# Patient Record
Sex: Male | Born: 1955 | Race: Black or African American | Hispanic: No | Smoking: Never smoker
Health system: Southern US, Community
[De-identification: ages and names within clinical notes are randomized; demographics above are authoritative.]

## PROBLEM LIST (undated history)

## (undated) ENCOUNTER — Ambulatory Visit: Disposition: A | Discharge status: Home / Self Care | Payer: 59

## (undated) DIAGNOSIS — T7840XA Allergy, unspecified, initial encounter: Secondary | ICD-10-CM

## (undated) DIAGNOSIS — E782 Mixed hyperlipidemia: Secondary | ICD-10-CM

## (undated) DIAGNOSIS — I10 Essential (primary) hypertension: Secondary | ICD-10-CM

## (undated) HISTORY — DX: Essential (primary) hypertension: I10

## (undated) HISTORY — PX: HAND SURGERY: SHX662

## (undated) HISTORY — DX: Allergy, unspecified, initial encounter: T78.40XA

## (undated) HISTORY — DX: Mixed hyperlipidemia: E78.2

---

## 2007-09-02 ENCOUNTER — Ambulatory Visit: Payer: Self-pay | Admitting: Gastroenterology

## 2007-09-02 LAB — CONVERTED CEMR LAB
Basophils Relative: 0.8 % (ref 0.0–1.0)
Eosinophils Relative: 5 % (ref 0.0–5.0)
HCT: 43.8 % (ref 39.0–52.0)
Hemoglobin: 15.5 g/dL (ref 13.0–17.0)
Lymphocytes Relative: 42.7 % (ref 12.0–46.0)
Monocytes Absolute: 0.5 10*3/uL (ref 0.2–0.7)
Neutrophils Relative %: 41.1 % — ABNORMAL LOW (ref 43.0–77.0)
RDW: 12.7 % (ref 11.5–14.6)
WBC: 4.7 10*3/uL (ref 4.5–10.5)

## 2007-09-07 ENCOUNTER — Ambulatory Visit: Payer: Self-pay | Admitting: Gastroenterology

## 2007-09-07 ENCOUNTER — Encounter: Payer: Self-pay | Admitting: Gastroenterology

## 2011-01-13 NOTE — Assessment & Plan Note (Signed)
[facility]                         GASTROENTEROLOGY OFFICE NOTE   Leon Lopez, Leon Lopez                         MRN:          [id number]  DATE:09/02/2007                            DOB:          1955-09-22    REFERRING PHYSICIAN:  Belva Lopez, M.D.   PRIMARY CARE PHYSICIAN:  Leon Lopez, M.D.   REASON FOR REFERRAL:  Dr. Belva Lopez asked me to evaluate Leon Lopez in  consultation regarding intermittent bright red blood per rectum.   HISTORY OF PRESENT ILLNESS:  Leon Lopez is a very pleasant 55 year old  man who has had approximately 6 weeks of intermittent bright red blood  per rectum.  This happens when he moves his bowels.  It sounds like it  is minor bleeding, just a little bit of blood on the tissue paper.  He  does not have constipation or diarrhea.  No significant abdominal pains.  He has never had this bleeding before.  He has never had a colonoscopy.   REVIEW OF SYSTEMS:  Notable for stable weight, and is otherwise  essentially normal, and is available on his nursing intake sheet.   PAST MEDICAL HISTORY:  Sinus trouble.   CURRENT MEDICATIONS:  None.   ALLERGIES:  No known drug allergies.   SOCIAL HISTORY:  Divorced.  Lives by himself.  He has three children.  Works for [employer].  Nonsmoker.  Drinks alcohol occasionally.   FAMILY HISTORY:  No colon cancer or colon polyps in the family.   PHYSICAL EXAMINATION:  VITAL SIGNS:  Height 5 feet 7 inches, weight 168  pounds, blood pressure 122/82, pulse 66.  CONSTITUTIONAL:  Generally well appearing.  NEUROLOGIC:  Alert and oriented x3.  HEENT:  Eyes:  Extraocular movements intact.  Mouth, oropharynx moist.  No lesions.  NECK:  Supple.  No lymphadenopathy.  CARDIOVASCULAR:  Heart:  Regular rate and rhythm.  LUNGS:  Clear to auscultation bilaterally.  ABDOMEN  Soft, nontender, nondistended.  Normal bowel sounds.  EXTREMITIES:  No lower extremity edema.  SKIN:  No rashes or lesions of  the visible extremities.  RECTAL:  No internal or external hemorrhoids.  No anal fissuring.  No  masses palpated on rectal examination.   ASSESSMENT AND PLAN:  A 55 year old man with limited rectal bleeding.   First, it does not appear that Leon Lopez is anemic clinically, but I  will get a CBC just to make sure.  The etiology of his mild rectal  bleeding is likely hemorrhoidal, although polyps and other neoplasms can  also cause this type of bleeding.  In the absence of diarrhea, it is  unlikely this is from colitis.  I will arrange for him to have a full  colonoscopy performed at his soonest convenience, and I see no reason  for any further blood tests or imaging studies other than that mentioned  above.     Leon Fee, MD  Electronically Signed    DPJ/MedQ  DD: 09/02/2007  DT: 09/02/2007  Job #: [id number]   cc:   Leon Lopez, M.D.  Leon Lopez, M.D.

## 2012-08-29 ENCOUNTER — Encounter: Payer: Self-pay | Admitting: Gastroenterology

## 2013-02-24 ENCOUNTER — Encounter: Payer: Self-pay | Admitting: Gastroenterology

## 2015-02-04 ENCOUNTER — Encounter (INDEPENDENT_AMBULATORY_CARE_PROVIDER_SITE_OTHER): Payer: Self-pay

## 2015-02-04 ENCOUNTER — Ambulatory Visit (INDEPENDENT_AMBULATORY_CARE_PROVIDER_SITE_OTHER): Payer: 59 | Admitting: Physician Assistant

## 2015-02-04 ENCOUNTER — Other Ambulatory Visit: Payer: Self-pay | Admitting: Physician Assistant

## 2015-02-04 ENCOUNTER — Encounter: Payer: Self-pay | Admitting: Physician Assistant

## 2015-02-04 ENCOUNTER — Ambulatory Visit (HOSPITAL_COMMUNITY)
Admission: RE | Admit: 2015-02-04 | Discharge: 2015-02-04 | Disposition: A | Discharge status: Home / Self Care | Payer: 59 | Source: Ambulatory Visit | Attending: Physician Assistant | Admitting: Physician Assistant

## 2015-02-04 ENCOUNTER — Ambulatory Visit: Payer: Self-pay

## 2015-02-04 VITALS — BP 141/86 | HR 75 | Temp 97.6°F | Ht 67.0 in | Wt 191.0 lb

## 2015-02-04 DIAGNOSIS — N5089 Other specified disorders of the male genital organs: Secondary | ICD-10-CM

## 2015-02-04 DIAGNOSIS — N451 Epididymitis: Secondary | ICD-10-CM

## 2015-02-04 DIAGNOSIS — N508 Other specified disorders of male genital organs: Secondary | ICD-10-CM

## 2015-02-04 DIAGNOSIS — N50812 Left testicular pain: Secondary | ICD-10-CM

## 2015-02-04 LAB — POCT URINALYSIS DIPSTICK
Bilirubin, UA: NEGATIVE
Glucose, UA: NEGATIVE
KETONES UA: NEGATIVE
LEUKOCYTES UA: NEGATIVE
Nitrite, UA: NEGATIVE
PH UA: 6
PROTEIN UA: NEGATIVE
Spec Grav, UA: 1.02
UROBILINOGEN UA: NEGATIVE

## 2015-02-04 LAB — POCT UA - MICROSCOPIC ONLY
BACTERIA, U MICROSCOPIC: NEGATIVE
CASTS, UR, LPF, POC: NEGATIVE
Crystals, Ur, HPF, POC: NEGATIVE
EPITHELIAL CELLS, URINE PER MICROSCOPY: NEGATIVE
YEAST UA: NEGATIVE

## 2015-02-04 MED ORDER — LEVOFLOXACIN 250 MG PO TABS: 250.0000 mg | ORAL_TABLET | Freq: Every day | ORAL | Status: DC

## 2015-02-04 NOTE — Progress Notes (Signed)
   Subjective:    Patient ID: Leon Lopez, male    DOB: Apr 26, 1956, 59 y.o.   MRN: [id number]  HPI 59 y/o male presents with c/o pain in testicles x 3 days. Woke up Saturday morning and felt immediate soreness in his groin. Pain has worsened and his left testicle is swollen. Has not tried anything to relieve symptoms.       Review of Systems  Constitutional: Negative.   Gastrointestinal:       Suprapubic , groin pain on left side   Genitourinary: Negative for dysuria, frequency, hematuria, difficulty urinating and genital sores.       Swollen right testicle   All other systems reviewed and are negative.      Objective:   Physical Exam  Constitutional: He is oriented to person, place, and time. He appears well-developed and well-nourished. No distress.  Cardiovascular: Normal rate.   Genitourinary: Penis normal.  Enlarged left testicle/scrotum. Very ttp No erythema or discoloration noted, however difficult to determine d/t patient being African American   Neurological: He is oriented to person, place, and time.  Skin: He is not diaphoretic.  Psychiatric: He has a normal mood and affect. His behavior is normal. Judgment and thought content normal.  Nursing note and vitals reviewed.         Assessment & Plan:  1. Pain in left testicle - Patient sent to [facility] for stat U/S to r/o testicular torsion - POCT urinalysis dipstick - POCT UA - Microscopic Only - US Scrotum; Future   Will determine plan of treatment  once radiology results obtained   Brayten Komar A. Chauncey ReadingGann PA-C

## 2015-02-14 ENCOUNTER — Ambulatory Visit (INDEPENDENT_AMBULATORY_CARE_PROVIDER_SITE_OTHER): Payer: 59 | Admitting: Physician Assistant

## 2015-02-14 ENCOUNTER — Encounter: Payer: Self-pay | Admitting: Physician Assistant

## 2015-02-14 VITALS — BP 128/82 | HR 75 | Temp 98.2°F | Ht 67.0 in | Wt 195.0 lb

## 2015-02-14 DIAGNOSIS — N451 Epididymitis: Secondary | ICD-10-CM | POA: Diagnosis not present

## 2015-02-14 NOTE — Progress Notes (Signed)
   Subjective:    Patient ID: Leon Lopez, male    DOB: [birth year], 59 y.o.   MRN: [id number]  HPI 59 y/o male presents for f/u of epididymitis. He has 1 pill of Levaquin remaining. He states that he is much improved. No swelling. States he has a "twinge" once in a while.     Review of Systems  Constitutional: Negative for fever, chills and diaphoresis.  Gastrointestinal: Negative for nausea.  Endocrine: Negative for polyuria.  Genitourinary: Negative for dysuria, urgency, frequency, hematuria, decreased urine volume, discharge, penile swelling, scrotal swelling, difficulty urinating, genital sores, penile pain and testicular pain.  All other systems reviewed and are negative.      Objective:   Physical Exam  Constitutional: He is oriented to person, place, and time. He appears well-developed and well-nourished. No distress.  Pulmonary/Chest: Effort normal.  Genitourinary: Penis normal.  Trace ttp of left testicle,scrotum  Decreased edema  Neurological: He is alert and oriented to person, place, and time.  Skin: He is not diaphoretic.  Psychiatric: He has a normal mood and affect. His behavior is normal. Judgment and thought content normal.  Nursing note and vitals reviewed.         Assessment & Plan:  1. Epididymitis, left -Continue taking remainder of antibiotic - If symptoms, pain recur prior to appointment with Urology, call office for additional antibiotic or evaluation   RTC prn   Tiffany A. Chauncey Reading PA-C

## 2019-04-12 NOTE — Progress Notes (Signed)
New Patient Office Visit  Assessment & Plan:  1. Well adult exam - Preventive care education provided. Encouraged exercise.  - CBC with Differential/Platelet - CMP14+EGFR - Lipid panel - Hepatitis C antibody - HIV Antibody (routine testing w rflx)  2. Immunization due - Tdap (ADACEL) 12-30-13.5 LF-MCG/0.5 injection; Inject 0.5 mLs into the muscle once for 1 dose.  Dispense: 0.5 mL; Refill: 0 - Zoster Vaccine Adjuvanted [facility]) injection; Inject 0.5 mLs into the muscle once for 1 dose.  Dispense: 0.5 mL; Refill: 0  3. Elevated BP without diagnosis of hypertension - Education provided on the DASH diet. Encouraged exercise and weight loss. Limit salt.  - CMP14+EGFR - Lipid panel  4. Leg cramps - Chronic problem. Patient does not drink very much water throughout the day; encouraged adequate hydration. Education provided on leg cramps. - CBC with Differential/Platelet - Magnesium  5. Obesity (BMI 30-39.9) - Encouraged exercise for weight loss.  - CMP14+EGFR - Lipid panel  6. Encounter for hepatitis C screening test for low risk patient - Hepatitis C antibody  7. Screening for HIV (human immunodeficiency virus) - HIV Antibody (routine testing w rflx)  8. Colon cancer screening - Ambulatory referral to Gastroenterology   Follow-up: Return in about 6 weeks (around 05/30/2019) for HTN.   Hendricks Limes, MSN, APRN, FNP-C [facility]  Subjective:  Patient ID: Leon Lopez, male    DOB: 03/31/1956  Age: 63 y.o. MRN: [id number]  Patient Care Team: Loman Brooklyn, FNP as PCP - General (Family Medicine)  CC:  Chief Complaint  Patient presents with  . New Patient (Initial Visit)  . Annual Exam    wants lab work    HPI Leon Lopez presents to establish care. Patient was last seen in 2016 by this practice, making him a new patient today. Patient has no concerns today.   Patient reports a lot of leg cramps that he has "always had". They go away  quickly.   Patient does not check BP at home at all. Denies shortness of breath, chest pain, headaches, or vision changes. He feels his BP is elevated today due to wearing the mask and being claustrophobic. He is able to check his BP at work.   Review of Systems  Constitutional: Negative for chills, fever, malaise/fatigue and weight loss.  HENT: Negative for congestion, ear discharge, ear pain, nosebleeds, sinus pain, sore throat and tinnitus.   Eyes: Negative for blurred vision, double vision, pain, discharge and redness.  Respiratory: Negative for cough, shortness of breath and wheezing.   Cardiovascular: Negative for chest pain, palpitations and leg swelling.  Gastrointestinal: Negative for abdominal pain, constipation, diarrhea, heartburn, nausea and vomiting.  Genitourinary: Negative for dysuria, frequency and urgency.       Denies trouble initiating a urine stream, weak stream, split stream, and dribbling.   Musculoskeletal: Negative for myalgias.  Skin: Negative for rash.  Neurological: Negative for dizziness, seizures, weakness and headaches.  Psychiatric/Behavioral: Negative for depression, substance abuse and suicidal ideas. The patient is not nervous/anxious.      Current Outpatient Medications:  .  Tdap (ADACEL) 12-30-13.5 LF-MCG/0.5 injection, Inject 0.5 mLs into the muscle once for 1 dose., Disp: 0.5 mL, Rfl: 0 .  Zoster Vaccine Adjuvanted (SHINGRIX) injection, Inject 0.5 mLs into the muscle once for 1 dose., Disp: 0.5 mL, Rfl: 0  No Known Allergies  History reviewed. No pertinent past medical history.  Past Surgical History:  Procedure Laterality Date  . HAND SURGERY Left  History reviewed. No pertinent family history.  Social History   Socioeconomic History  . Marital status: Divorced    Spouse name: Not on file  . Number of children: 3  . Years of education: 82  . Highest education level: High school graduate  Occupational History  . Not on file  Social  Needs  . Financial resource strain: Not on file  . Food insecurity    Worry: Not on file    Inability: Not on file  . Transportation needs    Medical: Not on file    Non-medical: Not on file  Tobacco Use  . Smoking status: Never Smoker  . Smokeless tobacco: Never Used  Substance and Sexual Activity  . Alcohol use: Yes    Alcohol/week: 0.0 standard drinks    Comment: 1-2 beers on weekends  . Drug use: Never  . Sexual activity: Yes  Lifestyle  . Physical activity    Days per week: Not on file    Minutes per session: Not on file  . Stress: Not on file  Relationships  . Social Herbalist on phone: Not on file    Gets together: Not on file    Attends religious service: Not on file    Active member of club or organization: Not on file    Attends meetings of clubs or organizations: Not on file    Relationship status: Not on file  . Intimate partner violence    Fear of current or ex partner: Not on file    Emotionally abused: Not on file    Physically abused: Not on file    Forced sexual activity: Not on file  Other Topics Concern  . Not on file  Social History Narrative  . Not on file    Objective:   Today's Vitals: BP (!) 170/104   Pulse 65   Temp 98.4 F (36.9 C) (Oral)   Ht '5\' 7"'$  (1.702 m)   Wt 194 lb 3.2 oz (88.1 kg)   BMI 30.42 kg/m   Physical Exam Vitals signs reviewed.  Constitutional:      General: He is not in acute distress.    Appearance: Normal appearance. He is obese. He is not ill-appearing, toxic-appearing or diaphoretic.  HENT:     Head: Normocephalic and atraumatic.     Right Ear: Tympanic membrane, ear canal and external ear normal. There is no impacted cerumen.     Left Ear: Tympanic membrane, ear canal and external ear normal. There is no impacted cerumen.     Nose: Nose normal. No congestion or rhinorrhea.     Mouth/Throat:     Mouth: Mucous membranes are moist.     Pharynx: Oropharynx is clear. No oropharyngeal exudate or  posterior oropharyngeal erythema.  Eyes:     General: No scleral icterus.       Right eye: No discharge.        Left eye: No discharge.     Conjunctiva/sclera: Conjunctivae normal.     Pupils: Pupils are equal, round, and reactive to light.  Neck:     Musculoskeletal: Normal range of motion and neck supple. No neck rigidity or muscular tenderness.     Vascular: No carotid bruit.  Cardiovascular:     Rate and Rhythm: Normal rate and regular rhythm.     Heart sounds: Normal heart sounds. No murmur. No friction rub. No gallop.   Pulmonary:     Effort: Pulmonary effort is normal. No respiratory distress.  Breath sounds: Normal breath sounds. No stridor. No wheezing, rhonchi or rales.  Abdominal:     General: Abdomen is flat. Bowel sounds are normal. There is no distension.     Palpations: Abdomen is soft. There is no mass.     Tenderness: There is no abdominal tenderness. There is no guarding or rebound.     Hernia: No hernia is present.  Musculoskeletal: Normal range of motion.     Right lower leg: No edema.     Left lower leg: No edema.  Lymphadenopathy:     Cervical: No cervical adenopathy.  Skin:    General: Skin is warm and dry.     Capillary Refill: Capillary refill takes less than 2 seconds.  Neurological:     General: No focal deficit present.     Mental Status: He is alert and oriented to person, place, and time. Mental status is at baseline.  Psychiatric:        Mood and Affect: Mood normal.        Behavior: Behavior normal.        Thought Content: Thought content normal.        Judgment: Judgment normal.

## 2019-04-18 ENCOUNTER — Other Ambulatory Visit: Payer: Self-pay

## 2019-04-18 ENCOUNTER — Ambulatory Visit (INDEPENDENT_AMBULATORY_CARE_PROVIDER_SITE_OTHER): Payer: 59 | Admitting: Family Medicine

## 2019-04-18 ENCOUNTER — Encounter: Payer: Self-pay | Admitting: Family Medicine

## 2019-04-18 VITALS — BP 170/104 | HR 65 | Temp 98.4°F | Ht 67.0 in | Wt 194.2 lb

## 2019-04-18 DIAGNOSIS — Z114 Encounter for screening for human immunodeficiency virus [HIV]: Secondary | ICD-10-CM

## 2019-04-18 DIAGNOSIS — E669 Obesity, unspecified: Secondary | ICD-10-CM

## 2019-04-18 DIAGNOSIS — R252 Cramp and spasm: Secondary | ICD-10-CM | POA: Diagnosis not present

## 2019-04-18 DIAGNOSIS — Z1211 Encounter for screening for malignant neoplasm of colon: Secondary | ICD-10-CM

## 2019-04-18 DIAGNOSIS — Z23 Encounter for immunization: Secondary | ICD-10-CM | POA: Diagnosis not present

## 2019-04-18 DIAGNOSIS — R030 Elevated blood-pressure reading, without diagnosis of hypertension: Secondary | ICD-10-CM

## 2019-04-18 DIAGNOSIS — Z1159 Encounter for screening for other viral diseases: Secondary | ICD-10-CM

## 2019-04-18 DIAGNOSIS — Z0001 Encounter for general adult medical examination with abnormal findings: Secondary | ICD-10-CM

## 2019-04-18 DIAGNOSIS — Z0000 Encounter for general adult medical examination without abnormal findings: Secondary | ICD-10-CM

## 2019-04-18 MED ORDER — TETANUS-DIPHTH-ACELL PERTUSSIS 5-2-15.5 LF-MCG/0.5 IM SUSP: 0.5000 mL | Freq: Once | INTRAMUSCULAR | 0 refills | Status: AC

## 2019-04-18 MED ORDER — SHINGRIX 50 MCG/0.5ML IM SUSR: 0.5000 mL | Freq: Once | INTRAMUSCULAR | 0 refills | Status: AC

## 2019-04-18 NOTE — Patient Instructions (Addendum)
Leg Cramps Leg cramps occur when one or more muscles tighten and you have no control over this tightening (involuntary muscle contraction). Muscle cramps can develop in any muscle, but the most common place is in the calf muscles of the leg. Those cramps can occur during exercise or when you are at rest. Leg cramps are painful, and they may last for a few seconds to a few minutes. Cramps may return several times before they finally stop. Usually, leg cramps are not caused by a serious medical problem. In many cases, the cause is not known. Some common causes include:  Excessive physical effort (overexertion), such as during intense exercise.  Overuse from repetitive motions, or doing the same thing over and over.  Staying in a certain position for a long period of time.  Improper preparation, form, or technique while performing a sport or an activity.  Dehydration.  Injury.  Side effects of certain medicines.  Abnormally low levels of minerals in your blood (electrolytes), especially potassium and calcium. This could result from: ? Pregnancy. ? Taking diuretic medicines. Follow these instructions at home: Eating and drinking  Drink enough fluid to keep your urine pale yellow. Staying hydrated may help prevent cramps.  Eat a healthy diet that includes plenty of nutrients to help your muscles function. A healthy diet includes fruits and vegetables, lean protein, whole grains, and low-fat or nonfat dairy products. Managing pain, stiffness, and swelling      Try massaging, stretching, and relaxing the affected muscle. Do this for several minutes at a time.  If directed, put ice on areas that are sore or painful after a cramp: ? Put ice in a plastic bag. ? Place a towel between your skin and the bag. ? Leave the ice on for 20 minutes, 2-3 times a day.  If directed, apply heat to muscles that are tense or tight. Do this before you exercise, or as often as told by your health care  provider. Use the heat source that your health care provider recommends, such as a moist heat pack or a heating pad. ? Place a towel between your skin and the heat source. ? Leave the heat on for 20-30 minutes. ? Remove the heat if your skin turns bright red. This is especially important if you are unable to feel pain, heat, or cold. You may have a greater risk of getting burned.  Try taking hot showers or baths to help relax tight muscles. General instructions  If you are having frequent leg cramps, avoid intense exercise for several days.  Take over-the-counter and prescription medicines only as told by your health care provider.  Keep all follow-up visits as told by your health care provider. This is important. Contact a health care provider if:  Your leg cramps get more severe or more frequent, or they do not improve over time.  Your foot becomes cold, numb, or blue. Summary  Muscle cramps can develop in any muscle, but the most common place is in the calf muscles of the leg.  Leg cramps are painful, and they may last for a few seconds to a few minutes.  Usually, leg cramps are not caused by a serious medical problem. Often, the cause is not known.  Stay hydrated and take over-the-counter and prescription medicines only as told by your health care provider. This information is not intended to replace advice given to you by your health care provider. Make sure you discuss any questions you have with your health  care provider. Document Released: 09/24/2004 Document Revised: 07/30/2017 Document Reviewed: 05/27/2017 Elsevier Patient Education  2020 [place] DASH stands for "Dietary Approaches to Stop Hypertension." The DASH eating plan is a healthy eating plan that has been shown to reduce high blood pressure (hypertension). It may also reduce your risk for type 2 diabetes, heart disease, and stroke. The DASH eating plan may also help with weight loss. What  are tips for following this plan?  General guidelines  Avoid eating more than 2,300 mg (milligrams) of salt (sodium) a day. If you have hypertension, you may need to reduce your sodium intake to 1,500 mg a day.  Limit alcohol intake to no more than 1 drink a day for nonpregnant women and 2 drinks a day for men. One drink equals 12 oz of beer, 5 oz of wine, or 1 oz of hard liquor.  Work with your health care provider to maintain a healthy body weight or to lose weight. Ask what an ideal weight is for you.  Get at least 30 minutes of exercise that causes your heart to beat faster (aerobic exercise) most days of the week. Activities may include walking, swimming, or biking.  Work with your health care provider or diet and nutrition specialist (dietitian) to adjust your eating plan to your individual calorie needs. Reading food labels   Check food labels for the amount of sodium per serving. Choose foods with less than 5 percent of the Daily Value of sodium. Generally, foods with less than 300 mg of sodium per serving fit into this eating plan.  To find whole grains, look for the word "whole" as the first word in the ingredient list. Shopping  Buy products labeled as "low-sodium" or "no salt added."  Buy fresh foods. Avoid canned foods and premade or frozen meals. Cooking  Avoid adding salt when cooking. Use salt-free seasonings or herbs instead of table salt or sea salt. Check with your health care provider or pharmacist before using salt substitutes.  Do not fry foods. Cook foods using healthy methods such as baking, boiling, grilling, and broiling instead.  Cook with heart-healthy oils, such as olive, canola, soybean, or sunflower oil. Meal planning  Eat a balanced diet that includes: ? 5 or more servings of fruits and vegetables each day. At each meal, try to fill half of your plate with fruits and vegetables. ? Up to 6-8 servings of whole grains each day. ? Less than 6 oz of  lean meat, poultry, or fish each day. A 3-oz serving of meat is about the same size as a deck of cards. One egg equals 1 oz. ? 2 servings of low-fat dairy each day. ? A serving of nuts, seeds, or beans 5 times each week. ? Heart-healthy fats. Healthy fats called Omega-3 fatty acids are found in foods such as flaxseeds and coldwater fish, like sardines, salmon, and mackerel.  Limit how much you eat of the following: ? Canned or prepackaged foods. ? Food that is high in trans fat, such as fried foods. ? Food that is high in saturated fat, such as fatty meat. ? Sweets, desserts, sugary drinks, and other foods with added sugar. ? Full-fat dairy products.  Do not salt foods before eating.  Try to eat at least 2 vegetarian meals each week.  Eat more home-cooked food and less restaurant, buffet, and fast food.  When eating at a restaurant, ask that your food be prepared with less salt or no  salt, if possible. What foods are recommended? The items listed may not be a complete list. Talk with your dietitian about what dietary choices are best for you. Grains Whole-grain or whole-wheat bread. Whole-grain or whole-wheat pasta. Brown rice. [place]. Bulgur. Whole-grain and low-sodium cereals. Pita bread. Low-fat, low-sodium crackers. Whole-wheat flour tortillas. Vegetables Fresh or frozen vegetables (raw, steamed, roasted, or grilled). Low-sodium or reduced-sodium tomato and vegetable juice. Low-sodium or reduced-sodium tomato sauce and tomato paste. Low-sodium or reduced-sodium canned vegetables. Fruits All fresh, dried, or frozen fruit. Canned fruit in natural juice (without added sugar). Meat and other protein foods Skinless chicken or [place]. Ground chicken or [place]. Pork with fat trimmed off. Fish and seafood. Egg whites. Dried beans, peas, or lentils. Unsalted nuts, nut butters, and seeds. Unsalted canned beans. Lean cuts of beef with fat trimmed off. Low-sodium, lean deli meat. Dairy  Low-fat (1%) or fat-free (skim) milk. Fat-free, low-fat, or reduced-fat cheeses. Nonfat, low-sodium ricotta or cottage cheese. Low-fat or nonfat yogurt. Low-fat, low-sodium cheese. Fats and oils Soft margarine without trans fats. Vegetable oil. Low-fat, reduced-fat, or light mayonnaise and salad dressings (reduced-sodium). Canola, safflower, olive, soybean, and sunflower oils. Avocado. Seasoning and other foods Herbs. Spices. Seasoning mixes without salt. Unsalted popcorn and pretzels. Fat-free sweets. What foods are not recommended? The items listed may not be a complete list. Talk with your dietitian about what dietary choices are best for you. Grains Baked goods made with fat, such as croissants, muffins, or some breads. Dry pasta or rice meal packs. Vegetables Creamed or fried vegetables. Vegetables in a cheese sauce. Regular canned vegetables (not low-sodium or reduced-sodium). Regular canned tomato sauce and paste (not low-sodium or reduced-sodium). Regular tomato and vegetable juice (not low-sodium or reduced-sodium). Angie Fava. Olives. Fruits Canned fruit in a light or heavy syrup. Fried fruit. Fruit in cream or butter sauce. Meat and other protein foods Fatty cuts of meat. Ribs. Fried meat. Berniece Salines. Sausage. Bologna and other processed lunch meats. Salami. Fatback. Hotdogs. Bratwurst. Salted nuts and seeds. Canned beans with added salt. Canned or smoked fish. Whole eggs or egg yolks. Chicken or [place] with skin. Dairy Whole or 2% milk, cream, and half-and-half. Whole or full-fat cream cheese. Whole-fat or sweetened yogurt. Full-fat cheese. Nondairy creamers. Whipped toppings. Processed cheese and cheese spreads. Fats and oils Butter. Stick margarine. Lard. Shortening. Ghee. Bacon fat. Tropical oils, such as coconut, palm kernel, or palm oil. Seasoning and other foods Salted popcorn and pretzels. Onion salt, garlic salt, seasoned salt, table salt, and sea salt. Worcestershire sauce. Tartar  sauce. Barbecue sauce. Teriyaki sauce. Soy sauce, including reduced-sodium. Steak sauce. Canned and packaged gravies. Fish sauce. Oyster sauce. Cocktail sauce. Horseradish that you find on the shelf. Ketchup. Mustard. Meat flavorings and tenderizers. Bouillon cubes. Hot sauce and Tabasco sauce. Premade or packaged marinades. Premade or packaged taco seasonings. Relishes. Regular salad dressings. Where to find more information:  National Heart, Lung, and [place]: https://wilson-eaton.com/  American Heart Association: www.heart.org Summary  The DASH eating plan is a healthy eating plan that has been shown to reduce high blood pressure (hypertension). It may also reduce your risk for type 2 diabetes, heart disease, and stroke.  With the DASH eating plan, you should limit salt (sodium) intake to 2,300 mg a day. If you have hypertension, you may need to reduce your sodium intake to 1,500 mg a day.  When on the DASH eating plan, aim to eat more fresh fruits and vegetables, whole grains, lean proteins, low-fat dairy, and heart-healthy  fats.  Work with your health care provider or diet and nutrition specialist (dietitian) to adjust your eating plan to your individual calorie needs. This information is not intended to replace advice given to you by your health care provider. Make sure you discuss any questions you have with your health care provider. Document Released: 08/06/2011 Document Revised: 07/30/2017 Document Reviewed: 08/10/2016 Elsevier Patient Education  2020 Else[employer] Inc[employer]   Preventive Care 73-23 Years Old, Male Preventive care refers to lifestyle choices and visits with your health care provider that can promote health and wellness. This includes:  A yearly physical exam. This is also called an annual well check.  Regular dental and eye exams.  Immunizations.  Screening for certain conditions.  Healthy lifestyle choices, such as eating a healthy diet, getting regular exercise, not  using drugs or products that contain nicotine and tobacco, and limiting alcohol use. What can I expect for my preventive care visit? Physical exam Your health care provider will check:  Height and weight. These may be used to calculate body mass index (BMI), which is a measurement that tells if you are at a healthy weight.  Heart rate and blood pressure.  Your skin for abnormal spots. Counseling Your health care provider may ask you questions about:  Alcohol, tobacco, and drug use.  Emotional well-being.  Home and relationship well-being.  Sexual activity.  Eating habits.  Work and work Statistician. What immunizations do I need?  Influenza (flu) vaccine  This is recommended every year. Tetanus, diphtheria, and pertussis (Tdap) vaccine  You may need a Td booster every 10 years. Varicella (chickenpox) vaccine  You may need this vaccine if you have not already been vaccinated. Zoster (shingles) vaccine  You may need this after age 26. Measles, mumps, and rubella (MMR) vaccine  You may need at least one dose of MMR if you were born in 1957 or later. You may also need a second dose. Pneumococcal conjugate (PCV13) vaccine  You may need this if you have certain conditions and were not previously vaccinated. Pneumococcal polysaccharide (PPSV23) vaccine  You may need one or two doses if you smoke cigarettes or if you have certain conditions. Meningococcal conjugate (MenACWY) vaccine  You may need this if you have certain conditions. Hepatitis A vaccine  You may need this if you have certain conditions or if you travel or work in places where you may be exposed to hepatitis A. Hepatitis B vaccine  You may need this if you have certain conditions or if you travel or work in places where you may be exposed to hepatitis B. Haemophilus influenzae type b (Hib) vaccine  You may need this if you have certain risk factors. Human papillomavirus (HPV) vaccine  If recommended  by your health care provider, you may need three doses over 6 months. You may receive vaccines as individual doses or as more than one vaccine together in one shot (combination vaccines). Talk with your health care provider about the risks and benefits of combination vaccines. What tests do I need? Blood tests  Lipid and cholesterol levels. These may be checked every 5 years, or more frequently if you are over 66 years old.  Hepatitis C test.  Hepatitis B test. Screening  Lung cancer screening. You may have this screening every year starting at age 97 if you have a 30-pack-year history of smoking and currently smoke or have quit within the past 15 years.  Prostate cancer screening. Recommendations will vary depending on your family history  and other risks.  Colorectal cancer screening. All adults should have this screening starting at age 67 and continuing until age 51. Your health care provider may recommend screening at age 4 if you are at increased risk. You will have tests every 1-10 years, depending on your results and the type of screening test.  Diabetes screening. This is done by checking your blood sugar (glucose) after you have not eaten for a while (fasting). You may have this done every 1-3 years.  Sexually transmitted disease (STD) testing. Follow these instructions at home: Eating and drinking  Eat a diet that includes fresh fruits and vegetables, whole grains, lean protein, and low-fat dairy products.  Take vitamin and mineral supplements as recommended by your health care provider.  Do not drink alcohol if your health care provider tells you not to drink.  If you drink alcohol: ? Limit how much you have to 0-2 drinks a day. ? Be aware of how much alcohol is in your drink. In the [place] one drink equals one 12 oz bottle of beer (355 mL), one 5 oz glass of wine (148 mL), or one 1 oz glass of hard liquor (44 mL). Lifestyle  Take daily care of your teeth and gums.   Stay active. Exercise for at least 30 minutes on 5 or more days each week.  Do not use any products that contain nicotine or tobacco, such as cigarettes, e-cigarettes, and chewing tobacco. If you need help quitting, ask your health care provider.  If you are sexually active, practice safe sex. Use a condom or other form of protection to prevent STIs (sexually transmitted infections).  Talk with your health care provider about taking a low-dose aspirin every day starting at age 66. What's next?  Go to your health care provider once a year for a well check visit.  Ask your health care provider how often you should have your eyes and teeth checked.  Stay up to date on all vaccines. This information is not intended to replace advice given to you by your health care provider. Make sure you discuss any questions you have with your health care provider. Document Released: 09/13/2015 Document Revised: 08/11/2018 Document Reviewed: 08/11/2018 Elsevier Patient Education  2020 [employer].

## 2019-04-19 ENCOUNTER — Other Ambulatory Visit: Payer: Self-pay | Admitting: Family Medicine

## 2019-04-19 ENCOUNTER — Telehealth: Payer: Self-pay | Admitting: Family Medicine

## 2019-04-19 ENCOUNTER — Encounter: Payer: Self-pay | Admitting: Family Medicine

## 2019-04-19 DIAGNOSIS — E782 Mixed hyperlipidemia: Secondary | ICD-10-CM

## 2019-04-19 LAB — CMP14+EGFR
ALT: 25 IU/L (ref 0–44)
AST: 24 IU/L (ref 0–40)
Albumin/Globulin Ratio: 1.8 (ref 1.2–2.2)
Albumin: 4.4 g/dL (ref 3.8–4.8)
Alkaline Phosphatase: 79 IU/L (ref 39–117)
BUN/Creatinine Ratio: 11 (ref 10–24)
BUN: 15 mg/dL (ref 8–27)
Bilirubin Total: 0.5 mg/dL (ref 0.0–1.2)
CO2: 25 mmol/L (ref 20–29)
Calcium: 9.8 mg/dL (ref 8.6–10.2)
Chloride: 102 mmol/L (ref 96–106)
Creatinine, Ser: 1.34 mg/dL — ABNORMAL HIGH (ref 0.76–1.27)
GFR calc Af Amer: 65 mL/min/{1.73_m2} (ref >59)
GFR calc non Af Amer: 56 mL/min/{1.73_m2} — ABNORMAL LOW (ref >59)
Globulin, Total: 2.4 g/dL (ref 1.5–4.5)
Glucose: 85 mg/dL (ref 65–99)
Potassium: 4.5 mmol/L (ref 3.5–5.2)
Sodium: 139 mmol/L (ref 134–144)
Total Protein: 6.8 g/dL (ref 6.0–8.5)

## 2019-04-19 LAB — CBC WITH DIFFERENTIAL/PLATELET
Basophils Absolute: 0 10*3/uL (ref 0.0–0.2)
Basos: 1 %
EOS (ABSOLUTE): 0.3 10*3/uL (ref 0.0–0.4)
Eos: 5 %
Hematocrit: 45.7 % (ref 37.5–51.0)
Hemoglobin: 15.3 g/dL (ref 13.0–17.7)
Immature Grans (Abs): 0 10*3/uL (ref 0.0–0.1)
Immature Granulocytes: 0 %
Lymphocytes Absolute: 3.2 10*3/uL — ABNORMAL HIGH (ref 0.7–3.1)
Lymphs: 53 %
MCH: 25.8 pg — ABNORMAL LOW (ref 26.6–33.0)
MCHC: 33.5 g/dL (ref 31.5–35.7)
MCV: 77 fL — ABNORMAL LOW (ref 79–97)
Monocytes Absolute: 0.5 10*3/uL (ref 0.1–0.9)
Monocytes: 8 %
Neutrophils Absolute: 2 10*3/uL (ref 1.4–7.0)
Neutrophils: 33 %
Platelets: 361 10*3/uL (ref 150–450)
RBC: 5.94 x10E6/uL — ABNORMAL HIGH (ref 4.14–5.80)
RDW: 13.9 % (ref 11.6–15.4)
WBC: 6 10*3/uL (ref 3.4–10.8)

## 2019-04-19 LAB — HEPATITIS C ANTIBODY: Hep C Virus Ab: 0.1 s/co ratio (ref 0.0–0.9)

## 2019-04-19 LAB — LIPID PANEL
Chol/HDL Ratio: 5.3 ratio — ABNORMAL HIGH (ref 0.0–5.0)
Cholesterol, Total: 175 mg/dL (ref 100–199)
HDL: 33 mg/dL — ABNORMAL LOW (ref >39)
LDL Calculated: 129 mg/dL — ABNORMAL HIGH (ref 0–99)
Triglycerides: 63 mg/dL (ref 0–149)
VLDL Cholesterol Cal: 13 mg/dL (ref 5–40)

## 2019-04-19 LAB — HIV ANTIBODY (ROUTINE TESTING W REFLEX): HIV Screen 4th Generation wRfx: NONREACTIVE

## 2019-04-19 LAB — MAGNESIUM: Magnesium: 2.1 mg/dL (ref 1.6–2.3)

## 2019-04-19 MED ORDER — ATORVASTATIN CALCIUM 10 MG PO TABS: 10.0000 mg | ORAL_TABLET | Freq: Every day | ORAL | 2 refills | Status: DC

## 2019-04-19 NOTE — Telephone Encounter (Signed)
Pt aware.

## 2019-05-09 ENCOUNTER — Encounter: Payer: Self-pay | Admitting: Gastroenterology

## 2019-05-12 ENCOUNTER — Other Ambulatory Visit: Payer: Self-pay | Admitting: Family Medicine

## 2019-05-12 DIAGNOSIS — E782 Mixed hyperlipidemia: Secondary | ICD-10-CM

## 2019-05-23 ENCOUNTER — Other Ambulatory Visit: Payer: Self-pay

## 2019-05-23 ENCOUNTER — Encounter: Payer: Self-pay | Admitting: Gastroenterology

## 2019-05-23 ENCOUNTER — Ambulatory Visit (AMBULATORY_SURGERY_CENTER): Payer: Self-pay

## 2019-05-23 VITALS — Temp 96.6°F | Ht 67.0 in | Wt 185.4 lb

## 2019-05-23 DIAGNOSIS — Z1211 Encounter for screening for malignant neoplasm of colon: Secondary | ICD-10-CM

## 2019-05-23 MED ORDER — PEG 3350-KCL-NA BICARB-NACL 420 G PO SOLR: 4000.0000 mL | Freq: Once | ORAL | 0 refills | Status: AC

## 2019-05-23 NOTE — Progress Notes (Signed)
Denies allergies to eggs or soy products. Denies complication of anesthesia or sedation. Denies use of weight loss medication. Denies use of O2.   Emmi instructions given for colonoscopy.  

## 2019-05-26 ENCOUNTER — Encounter (HOSPITAL_COMMUNITY): Payer: Self-pay

## 2019-05-26 ENCOUNTER — Emergency Department (HOSPITAL_COMMUNITY)
Admission: EM | Admit: 2019-05-26 | Discharge: 2019-05-26 | Disposition: A | Discharge status: Home / Self Care | Payer: 59 | Attending: Emergency Medicine | Admitting: Emergency Medicine

## 2019-05-26 ENCOUNTER — Other Ambulatory Visit: Payer: Self-pay | Admitting: Family Medicine

## 2019-05-26 ENCOUNTER — Other Ambulatory Visit: Payer: Self-pay

## 2019-05-26 DIAGNOSIS — U071 COVID-19: Secondary | ICD-10-CM

## 2019-05-26 DIAGNOSIS — Z7982 Long term (current) use of aspirin: Secondary | ICD-10-CM | POA: Diagnosis not present

## 2019-05-26 DIAGNOSIS — E782 Mixed hyperlipidemia: Secondary | ICD-10-CM

## 2019-05-26 DIAGNOSIS — R52 Pain, unspecified: Secondary | ICD-10-CM | POA: Diagnosis present

## 2019-05-26 DIAGNOSIS — B349 Viral infection, unspecified: Secondary | ICD-10-CM | POA: Diagnosis not present

## 2019-05-26 HISTORY — DX: COVID-19: U07.1

## 2019-05-26 MED ORDER — ACETAMINOPHEN 325 MG PO TABS
650.0000 mg | ORAL_TABLET | Freq: Once | ORAL | Status: AC
  Administered 2019-05-26: 650 mg via ORAL
  Filled 2019-05-26: qty 2

## 2019-05-26 NOTE — ED Triage Notes (Signed)
Pt reports covid symptoms x 7 days.  Symptoms include not tasting or smelling, cough, shortness of breath, fever, body aches, and headache.

## 2019-05-26 NOTE — Discharge Instructions (Addendum)
I suspect you have a viral illness that should run its course, but you will be notified if your Covid test is positive - this test should result within the next 24-48 hours.  You should maintain home isolation until you have your result (and it is negative).  If positive you will need to extend your home isolation for a total of 14 days (from the onset of symptoms).  Return here for any problems with shortness of breath, uncontrolled fever or severe weakness.  Rest,  Drink plenty of fluids.  Take motrin or tylenol for achiness and fever reduction.        Person Under Monitoring Name: Leon MannersJerry Lopez  Location: [id number] [place] [id number]   Infection Prevention Recommendations for Individuals Confirmed to have, or Being Evaluated for, 2019 Novel Coronavirus (COVID-19) Infection Who Receive Care at Home  Individuals who are confirmed to have, or are being evaluated for, COVID-19 should follow the prevention steps below until a healthcare provider or local or state health department says they can return to normal activities.  Stay home except to get medical care You should restrict activities outside your home, except for getting medical care. Do not go to work, school, or public areas, and do not use public transportation or taxis.  Call ahead before visiting your doctor Before your medical appointment, call the healthcare provider and tell them that you have, or are being evaluated for, COVID-19 infection. This will help the healthcare providers office take steps to keep other people from getting infected. Ask your healthcare provider to call the local or state health department.  Monitor your symptoms Seek prompt medical attention if your illness is worsening (e.g., difficulty breathing). Before going to your medical appointment, call the healthcare provider and tell them that you have, or are being evaluated for, COVID-19 infection. Ask your healthcare provider to call the local or  state health department.  Wear a facemask You should wear a facemask that covers your nose and mouth when you are in the same room with other people and when you visit a healthcare provider. People who live with or visit you should also wear a facemask while they are in the same room with you.  Separate yourself from other people in your home As much as possible, you should stay in a different room from other people in your home. Also, you should use a separate bathroom, if available.  Avoid sharing household items You should not share dishes, drinking glasses, cups, eating utensils, towels, bedding, or other items with other people in your home. After using these items, you should wash them thoroughly with soap and water.  Cover your coughs and sneezes Cover your mouth and nose with a tissue when you cough or sneeze, or you can cough or sneeze into your sleeve. Throw used tissues in a lined trash can, and immediately wash your hands with soap and water for at least 20 seconds or use an alcohol-based hand rub.  Wash your [employer]hands Wash your hands often and thoroughly with soap and water for at least 20 seconds. You can use an alcohol-based hand sanitizer if soap and water are not available and if your hands are not visibly dirty. Avoid touching your eyes, nose, and mouth with unwashed hands.   Prevention Steps for Caregivers and Household Members of Individuals Confirmed to have, or Being Evaluated for, COVID-19 Infection Being Cared for in the Home  If you live with, or provide care at home for, a  person confirmed to have, or being evaluated for, COVID-19 infection please follow these guidelines to prevent infection:  Follow healthcare providers instructions Make sure that you understand and can help the patient follow any healthcare provider instructions for all care.  Provide for the patients basic needs You should help the patient with basic needs in the home and provide support  for getting groceries, prescriptions, and other personal needs.  Monitor the patients symptoms If they are getting sicker, call his or her medical provider and tell them that the patient has, or is being evaluated for, COVID-19 infection. This will help the healthcare providers office take steps to keep other people from getting infected. Ask the healthcare provider to call the local or state health department.  Limit the number of people who have contact with the patient If possible, have only one caregiver for the patient. Other household members should stay in another home or place of residence. If this is not possible, they should stay in another room, or be separated from the patient as much as possible. Use a separate bathroom, if available. Restrict visitors who do not have an essential need to be in the home.  Keep older adults, very young children, and other sick people away from the patient Keep older adults, very young children, and those who have compromised immune systems or chronic health conditions away from the patient. This includes people with chronic heart, lung, or kidney conditions, diabetes, and cancer.  Ensure good ventilation Make sure that shared spaces in the home have good air flow, such as from an air conditioner or an opened window, weather permitting.  Wash your hands often Wash your hands often and thoroughly with soap and water for at least 20 seconds. You can use an alcohol based hand sanitizer if soap and water are not available and if your hands are not visibly dirty. Avoid touching your eyes, nose, and mouth with unwashed hands. Use disposable paper towels to dry your hands. If not available, use dedicated cloth towels and replace them when they become wet.  Wear a facemask and gloves Wear a disposable facemask at all times in the room and gloves when you touch or have contact with the patients blood, body fluids, and/or secretions or excretions, such  as sweat, saliva, sputum, nasal mucus, vomit, urine, or feces.  Ensure the mask fits over your nose and mouth tightly, and do not touch it during use. Throw out disposable facemasks and gloves after using them. Do not reuse. Wash your hands immediately after removing your facemask and gloves. If your personal clothing becomes contaminated, carefully remove clothing and launder. Wash your hands after handling contaminated clothing. Place all used disposable facemasks, gloves, and other waste in a lined container before disposing them with other household waste. Remove gloves and wash your hands immediately after handling these items.  Do not share dishes, glasses, or other household items with the patient Avoid sharing household items. You should not share dishes, drinking glasses, cups, eating utensils, towels, bedding, or other items with a patient who is confirmed to have, or being evaluated for, COVID-19 infection. After the person uses these items, you should wash them thoroughly with soap and water.  Wash laundry thoroughly Immediately remove and wash clothes or bedding that have blood, body fluids, and/or secretions or excretions, such as sweat, saliva, sputum, nasal mucus, vomit, urine, or feces, on them. Wear gloves when handling laundry from the patient. Read and follow directions on labels of laundry or  clothing items and detergent. In general, wash and dry with the warmest temperatures recommended on the label.  Clean all areas the individual has used often Clean all touchable surfaces, such as counters, tabletops, doorknobs, bathroom fixtures, toilets, phones, keyboards, tablets, and bedside tables, every day. Also, clean any surfaces that may have blood, body fluids, and/or secretions or excretions on them. Wear gloves when cleaning surfaces the patient has come in contact with. Use a diluted bleach solution (e.g., dilute bleach with 1 part bleach and 10 parts water) or a household  disinfectant with a label that says EPA-registered for coronaviruses. To make a bleach solution at home, add 1 tablespoon of bleach to 1 quart (4 cups) of water. For a larger supply, add  cup of bleach to 1 gallon (16 cups) of water. Read labels of cleaning products and follow recommendations provided on product labels. Labels contain instructions for safe and effective use of the cleaning product including precautions you should take when applying the product, such as wearing gloves or eye protection and making sure you have good ventilation during use of the product. Remove gloves and wash hands immediately after cleaning.  Monitor yourself for signs and symptoms of illness Caregivers and household members are considered close contacts, should monitor their health, and will be asked to limit movement outside of the home to the extent possible. Follow the monitoring steps for close contacts listed on the symptom monitoring form.   ? If you have additional questions, contact your local health department or call the epidemiologist on call at [phone] (available 24/7). ? This guidance is subject to change. For the most up-to-date guidance from [facility], please refer to their website: [email or web address]

## 2019-05-27 NOTE — ED Provider Notes (Signed)
[facility] EMERGENCY DEPARTMENT Provider Note   CSN: [id number] Arrival date & time: 05/26/19  1911     History   Chief Complaint Chief Complaint  Patient presents with  . covid symptoms    HPI Leon Lopez is a 63 y.o. male with a now 5 day history of flu like symptoms including generalized body aches with subjective fever including hot flashes and chills, nasal congestion, dry cough, headache, body aches and has noted reduction in ability to taste and smell.  He denies sob, cp, neck pain or stiffness, no n/v, abdominal pain, denies sore throat.  He has had no treatment prior to arrival.  Denies known exposure to Covid but is exposed to coworkers with his job.     The history is provided by the patient.    Past Medical History:  Diagnosis Date  . Allergy   . Mixed hyperlipidemia     Patient Active Problem List   Diagnosis Date Noted  . Mixed hyperlipidemia     Past Surgical History:  Procedure Laterality Date  . HAND SURGERY Left         Home Medications    Prior to Admission medications   Medication Sig Start Date End Date Taking? Authorizing Provider  aspirin EC 81 MG tablet Take 81 mg by mouth daily.    [provider]  atorvastatin (LIPITOR) 10 MG tablet TAKE 1 TABLET BY MOUTH EVERY DAY 05/26/19   Gwenlyn Fudge, FNP    Family History Family History  Problem Relation Age of Onset  . Colon cancer Neg Hx   . Esophageal cancer Neg Hx   . Rectal cancer Neg Hx   . Stomach cancer Neg Hx     Social History Social History   Tobacco Use  . Smoking status: Never Smoker  . Smokeless tobacco: Never Used  Substance Use Topics  . Alcohol use: Yes    Alcohol/week: 0.0 standard drinks    Comment: 1-2 beers on weekends  . Drug use: Never     Allergies   Patient has no known allergies.   Review of Systems Review of Systems  Constitutional: Positive for chills and fever.  HENT: Positive for congestion. Negative for ear pain, rhinorrhea,  sinus pressure, sore throat, trouble swallowing and voice change.   Eyes: Negative for discharge.  Respiratory: Positive for cough. Negative for shortness of breath, wheezing and stridor.   Cardiovascular: Negative for chest pain.  Gastrointestinal: Negative for abdominal pain, nausea and vomiting.  Genitourinary: Negative.   Musculoskeletal: Positive for myalgias.  Skin: Negative.      Physical Exam Updated Vital Signs BP 121/85 (BP Location: Right Arm)   Pulse 90   Temp 99.7 F (37.6 C) (Oral)   Resp 16   Ht 5\' 7"  (1.702 m)   Wt 83.5 kg   SpO2 100%   BMI 28.82 kg/m   Physical Exam Vitals signs and nursing note reviewed.  Constitutional:      Appearance: He is well-developed.  HENT:     Head: Normocephalic and atraumatic.     Nose: Congestion present. No rhinorrhea.     Mouth/Throat:     Pharynx: Uvula midline. No oropharyngeal exudate or posterior oropharyngeal erythema.     Tonsils: No tonsillar abscesses.  Eyes:     Conjunctiva/sclera: Conjunctivae normal.  Cardiovascular:     Rate and Rhythm: Normal rate.     Heart sounds: Normal heart sounds.  Pulmonary:     Effort: Pulmonary effort is normal. No  respiratory distress.     Breath sounds: No wheezing, rhonchi or rales.  Abdominal:     Palpations: Abdomen is soft.     Tenderness: There is no abdominal tenderness.  Musculoskeletal: Normal range of motion.  Skin:    General: Skin is warm and dry.     Findings: No rash.  Neurological:     Mental Status: He is alert and oriented to person, place, and time.      ED Treatments / Results  Labs (all labs ordered are listed, but only abnormal results are displayed) Labs Reviewed  NOVEL CORONAVIRUS, NAA (HOSP ORDER, SEND-OUT TO REF LAB; TAT 18-24 HRS)    EKG None  Radiology No results found.  Procedures Procedures (including critical care time)  Medications Ordered in ED Medications  acetaminophen (TYLENOL) tablet 650 mg (650 mg Oral Given 05/26/19  2240)     Initial Impression / Assessment and Plan / ED Course  I have reviewed the triage vital signs and the nursing notes.  Pertinent labs & imaging results that were available during my care of the patient were reviewed by me and considered in my medical decision making (see chart for details).        Pt with sx suggesting viral syndrome, possible Covid, but in no respiratory distress, exam and VS reassuring.  Covid screening provided.  Advised home isolation pending Covid results and for additional 10 days if test is positive.  Return precautions and home tx for sx relief discussed.  Leon Lopez was evaluated in Emergency Department on 05/27/2019 for the symptoms described in the history of present illness. He was evaluated in the context of the global COVID-19 pandemic, which necessitated consideration that the patient might be at risk for infection with the SARS-CoV-2 virus that causes COVID-19. Institutional protocols and algorithms that pertain to the evaluation of patients at risk for COVID-19 are in a state of rapid change based on information released by regulatory bodies including the CDC and federal and state organizations. These policies and algorithms were followed during the patient's care in the ED.   Final Clinical Impressions(s) / ED Diagnoses   Final diagnoses:  Viral syndrome    ED Discharge Orders    None       Landis Martins 05/27/19 1331    Milton Ferguson, MD 05/31/19 1015

## 2019-05-28 LAB — NOVEL CORONAVIRUS, NAA (HOSP ORDER, SEND-OUT TO REF LAB; TAT 18-24 HRS): SARS-CoV-2, NAA: DETECTED — AB

## 2019-06-02 ENCOUNTER — Ambulatory Visit: Payer: 59 | Admitting: Family Medicine

## 2019-06-06 ENCOUNTER — Ambulatory Visit (INDEPENDENT_AMBULATORY_CARE_PROVIDER_SITE_OTHER): Payer: 59

## 2019-06-06 ENCOUNTER — Encounter: Payer: 59 | Admitting: Gastroenterology

## 2019-06-06 ENCOUNTER — Ambulatory Visit
Admission: EM | Admit: 2019-06-06 | Discharge: 2019-06-06 | Disposition: A | Discharge status: Home / Self Care | Payer: 59 | Attending: Emergency Medicine | Admitting: Emergency Medicine

## 2019-06-06 ENCOUNTER — Ambulatory Visit (INDEPENDENT_AMBULATORY_CARE_PROVIDER_SITE_OTHER): Payer: 59 | Admitting: Family Medicine

## 2019-06-06 ENCOUNTER — Encounter: Payer: Self-pay | Admitting: Family Medicine

## 2019-06-06 ENCOUNTER — Other Ambulatory Visit: Payer: Self-pay

## 2019-06-06 DIAGNOSIS — R0602 Shortness of breath: Secondary | ICD-10-CM | POA: Diagnosis not present

## 2019-06-06 DIAGNOSIS — J181 Lobar pneumonia, unspecified organism: Secondary | ICD-10-CM

## 2019-06-06 DIAGNOSIS — U071 COVID-19: Secondary | ICD-10-CM

## 2019-06-06 DIAGNOSIS — R05 Cough: Secondary | ICD-10-CM

## 2019-06-06 DIAGNOSIS — J189 Pneumonia, unspecified organism: Secondary | ICD-10-CM

## 2019-06-06 MED ORDER — ALBUTEROL SULFATE HFA 108 (90 BASE) MCG/ACT IN AERS: 2.0000 | INHALATION_SPRAY | Freq: Four times a day (QID) | RESPIRATORY_TRACT | 1 refills | Status: AC | PRN

## 2019-06-06 MED ORDER — BENZONATATE 100 MG PO CAPS: 100.0000 mg | ORAL_CAPSULE | Freq: Three times a day (TID) | ORAL | 0 refills | Status: DC | PRN

## 2019-06-06 MED ORDER — AMOXICILLIN 500 MG PO CAPS: 500.0000 mg | ORAL_CAPSULE | Freq: Three times a day (TID) | ORAL | 0 refills | Status: AC

## 2019-06-06 MED ORDER — CEFTRIAXONE SODIUM 1 G IJ SOLR
1.0000 g | Freq: Once | INTRAMUSCULAR | Status: AC
  Administered 2019-06-06: 1 g via INTRAMUSCULAR

## 2019-06-06 MED ORDER — AZITHROMYCIN 250 MG PO TABS: 250.0000 mg | ORAL_TABLET | Freq: Every day | ORAL | 0 refills | Status: DC

## 2019-06-06 NOTE — Patient Instructions (Signed)

## 2019-06-06 NOTE — Discharge Instructions (Signed)
X-rays concerning for pneumonia of the left lobe Ceftriaxone injection given in office Get rest and push fluids Use tessalon perles and inhaler as needed Amoxicillin and azithromycin prescribed.  Take as directed and to completion Follow up with PCP in 1-2 days via video visit for recheck and to ensure your symptoms are improving Return or go to ER if you have any new or worsening symptoms fever, chills, nausea, vomiting, worsening cough, shortness of breath, chest pain, abdominal pain, nausea, vomiting, changes in bowel or bladder function, etc...  Recommend repeat x-ray in 6 weeks to exclude underlying malignancy

## 2019-06-06 NOTE — Progress Notes (Signed)
   Virtual Visit via Telephone Note  I connected with Leon Lopez on 06/06/19 at 8:24 AM by telephone and verified that I am speaking with the correct person using two identifiers. Leon Lopez is currently located at home and nobody is currently with him during this visit. The provider, Loman Brooklyn, FNP is located in their home at time of visit.  I discussed the limitations, risks, security and privacy concerns of performing an evaluation and management service by telephone and the availability of in person appointments. I also discussed with the patient that there may be a patient responsible charge related to this service. The patient expressed understanding and agreed to proceed.  Subjective: PCP: Loman Brooklyn, FNP  Chief Complaint  Patient presents with  . Breathing Problem  . Fatigue   Patient is experiencing breathing issues including difficulty breathing, coughing, and fatigue since being diagnosed with COVID-19. In addition he reports this has caused much anxiety and stress. He denies any fever for the past week. He has been unable to return to work due to ongoing symptoms. The nurse at work has asked that he go for a respiratory assessment and possible referral to a pulmonologist. He was seen in the ER on 05/26/2019 and given Tylenol.   ROS: Per HPI  Current Outpatient Medications:  .  aspirin EC 81 MG tablet, Take 81 mg by mouth daily., Disp: , Rfl:  .  atorvastatin (LIPITOR) 10 MG tablet, TAKE 1 TABLET BY MOUTH EVERY DAY, Disp: 90 tablet, Rfl: 1  No Known Allergies Past Medical History:  Diagnosis Date  . Allergy   . COVID-19   . Mixed hyperlipidemia     Observations/Objective: A&O  No respiratory distress or wheezing audible over the phone Mood, judgement, and thought processes all WNL  Assessment and Plan: 1. COVID-19 - Patient with ongoing symptoms of COVID-19. Encouraged to go to urgent care as I am unable to bring him in to the office for respiratory  assessment due to symptoms. Discussed that a referral to a pulmonologist is not likely going to occur but that urgent care could decide this based on assessment. He is to work this Saturday, Sunday, and Monday - I have wrote him out of work until Tuesday to allow time for healing, improvement in symptoms, and appointment at urgent care. Discussed if breathing gets bad he needs to go to ER.   Follow Up Instructions:  I discussed the assessment and treatment plan with the patient. The patient was provided an opportunity to ask questions and all were answered. The patient agreed with the plan and demonstrated an understanding of the instructions.   The patient was advised to call back or seek an in-person evaluation if the symptoms worsen or if the condition fails to improve as anticipated.  The above assessment and management plan was discussed with the patient. The patient verbalized understanding of and has agreed to the management plan. Patient is aware to call the clinic if symptoms persist or worsen. Patient is aware when to return to the clinic for a follow-up visit. Patient educated on when it is appropriate to go to the emergency department.   Time call ended: 8:44 AM  I provided 20 minutes of non-face-to-face time during this encounter.  Hendricks Limes, MSN, APRN, FNP-C [facility] 06/06/19

## 2019-06-06 NOTE — ED Provider Notes (Signed)
[facility]   [id number] 06/06/19 Arrival Time: 1109   CC: COVID infection; follow up  SUBJECTIVE: History from: patient.  Leon Lopez is a 63 y.o. male who presents with SOB with exertion that began a few weeks ago.  Diagnosed with COVID on 05/27/2019, has been symptomatic since 9/20-21/20.  Complains of SOB and mild dry cough, initially was running fevers, now afebril.  Had tele-visit with PCP this morning.  Was sent by PCP for in-person evaluation. PCP wrote him out of work until next week and sent in inhaler as well as tesslon perles.  Has not picked up prescriptions yet.   Denies fever, chills, fatigue, sinus pain, rhinorrhea, sore throat, wheezing, chest pain, chest pressure, nausea, vomiting, changes in bowel or bladder habits.    ROS: As per HPI.  All other pertinent ROS negative.     Past Medical History:  Diagnosis Date  . Allergy   . COVID-19 05/26/2019  . Mixed hyperlipidemia    Past Surgical History:  Procedure Laterality Date  . HAND SURGERY Left    No Known Allergies No current facility-administered medications on file prior to encounter.    Current Outpatient Medications on File Prior to Encounter  Medication Sig Dispense Refill  . albuterol (VENTOLIN HFA) 108 (90 Base) MCG/ACT inhaler Inhale 2 puffs into the lungs every 6 (six) hours as needed for wheezing or shortness of breath. 18 g 1  . aspirin EC 81 MG tablet Take 81 mg by mouth daily.    Marland Kitchen atorvastatin (LIPITOR) 10 MG tablet TAKE 1 TABLET BY MOUTH EVERY DAY 90 tablet 1  . benzonatate (TESSALON PERLES) 100 MG capsule Take 1 capsule (100 mg total) by mouth 3 (three) times daily as needed for cough. 30 capsule 0   Social History   Socioeconomic History  . Marital status: Divorced    Spouse name: Not on file  . Number of children: 3  . Years of education: 20  . Highest education level: High school graduate  Occupational History  . Not on file  Social Needs  . Financial resource strain: Not on  file  . Food insecurity    Worry: Not on file    Inability: Not on file  . Transportation needs    Medical: Not on file    Non-medical: Not on file  Tobacco Use  . Smoking status: Never Smoker  . Smokeless tobacco: Never Used  Substance and Sexual Activity  . Alcohol use: Yes    Alcohol/week: 0.0 standard drinks    Comment: 1-2 beers on weekends  . Drug use: Never  . Sexual activity: Yes  Lifestyle  . Physical activity    Days per week: Not on file    Minutes per session: Not on file  . Stress: Not on file  Relationships  . Social Musician on phone: Not on file    Gets together: Not on file    Attends religious service: Not on file    Active member of club or organization: Not on file    Attends meetings of clubs or organizations: Not on file    Relationship status: Not on file  . Intimate partner violence    Fear of current or ex partner: Not on file    Emotionally abused: Not on file    Physically abused: Not on file    Forced sexual activity: Not on file  Other Topics Concern  . Not on file  Social History Narrative  .  Not on file   Family History  Problem Relation Age of Onset  . Colon cancer Neg Hx   . Esophageal cancer Neg Hx   . Rectal cancer Neg Hx   . Stomach cancer Neg Hx     OBJECTIVE:  Vitals:   06/06/19 1143  BP: (!) 155/95  Pulse: 78  Resp: 20  Temp: 98.1 F (36.7 C)  SpO2: 97%     General appearance: alert; well-appearing, nontoxic; speaking in full sentences and tolerating own secretions HEENT: NCAT; Ears: EACs clear, TMs pearly gray; Eyes: PERRL.  EOM grossly intact. Nose: nares patent without rhinorrhea, Throat: oropharynx clear, tonsils non erythematous or enlarged, uvula midline  Neck: supple without LAD Lungs: unlabored respirations, symmetrical air entry; cough: absent; no respiratory distress; CTAB Heart: regular rate and rhythm.  Radial pulses 2+ symmetrical bilaterally Skin: warm and dry Psychological: alert and  cooperative; normal mood and affect  DIAGNOSTIC STUDIES:  Dg Chest 2 View  Result Date: 06/06/2019 CLINICAL DATA:  Shortness of breath. EXAM: CHEST - 2 VIEW COMPARISON:  None. FINDINGS: The heart size and mediastinal contours are within normal limits. No pneumothorax or pleural effusion is noted. Right lung is clear. Mild left basilar atelectasis or infiltrate is noted. The visualized skeletal structures are unremarkable. IMPRESSION: Mild left basilar atelectasis or pneumonia is noted. Electronically Signed   By: Marijo Conception M.D.   On: 06/06/2019 12:15     X-rays concerning for infiltrate in LEFT lobe  I have reviewed the x-rays myself and the radiologist interpretation. I am in agreement with the radiologist interpretation.     ASSESSMENT & PLAN:  1. COVID-19 virus infection   2. Pneumonia of left lower lobe due to infectious organism     Meds ordered this encounter  Medications  . amoxicillin (AMOXIL) 500 MG capsule    Sig: Take 1 capsule (500 mg total) by mouth 3 (three) times daily for 10 days.    Dispense:  30 capsule    Refill:  0    Order Specific Question:   Supervising Provider    Answer:   Raylene Everts [[id number]]  . azithromycin (ZITHROMAX) 250 MG tablet    Sig: Take 1 tablet (250 mg total) by mouth daily. Take first 2 tablets together, then 1 every day until finished.    Dispense:  6 tablet    Refill:  0    Order Specific Question:   Supervising Provider    Answer:   Raylene Everts [[id number]]  . cefTRIAXone (ROCEPHIN) injection 1 g    X-rays concerning for pneumonia of the left lobe Ceftriaxone injection given in office Get rest and push fluids Use tessalon perles and inhaler as needed Amoxicillin and azithromycin prescribed.  Take as directed and to completion Follow up with PCP in 1-2 days via video visit for recheck and to ensure your symptoms are improving Return or go to ER if you have any new or worsening symptoms fever, chills, nausea, vomiting,  worsening cough, shortness of breath, chest pain, abdominal pain, nausea, vomiting, changes in bowel or bladder function, etc...  Recommend repeat x-ray in 6 weeks to exclude underlying malignancy  Reviewed expectations re: course of current medical issues. Questions answered. Outlined signs and symptoms indicating need for more acute intervention. Patient verbalized understanding. After Visit Summary given.         Lestine Box, PA-C 06/06/19 1323

## 2019-06-06 NOTE — ED Triage Notes (Signed)
Pt has been symptom free from covid for past 7 days except for SOB with mild exertion

## 2019-06-16 ENCOUNTER — Other Ambulatory Visit: Payer: Self-pay

## 2019-06-18 NOTE — Progress Notes (Signed)
Assessment & Plan:  1. Essential hypertension - Uncontrolled. Education provided on the DASH diet. Encouraged diet and exercise.  - amLODipine (NORVASC) 5 MG tablet; Take 1 tablet (5 mg total) by mouth daily.  Dispense: 30 tablet; Refill: 2   Return in about 6 weeks (around 07/31/2019) for HTN.  Deliah Boston, MSN, APRN, FNP-C [facility]  Subjective:    Patient ID: Leon Lopez, male    DOB: 12/31/1955, 63 y.o.   MRN: [id number]  Patient Care Team: Gwenlyn Fudge, FNP as PCP - General (Family Medicine)   Chief Complaint:  Chief Complaint  Patient presents with   Hypertension    HPI: Leon Lopez is a 63 y.o. male presenting on 06/19/2019 for Hypertension  Hypertension: Patient here for follow-up of elevated blood pressure. Patient had an elevated BP at his last visit. He was encouraged to work on lifestyle changes at that time. He is not exercising and is not adherent to low salt diet.  Blood pressure is not well controlled at home. Cardiac symptoms none. Cardiovascular risk factors: advanced age (older than 57 for men, 17 for women), dyslipidemia, hypertension and male gender. Use of agents associated with hypertension: none. History of target organ damage: none.   New complaints: None  Social history:  Relevant past medical, surgical, family and social history reviewed and updated as indicated. Interim medical history since our last visit reviewed.  Allergies and medications reviewed and updated.  DATA REVIEWED: CHART IN EPIC  ROS: Negative unless specifically indicated above in HPI.    Current Outpatient Medications:    albuterol (VENTOLIN HFA) 108 (90 Base) MCG/ACT inhaler, Inhale 2 puffs into the lungs every 6 (six) hours as needed for wheezing or shortness of breath., Disp: 18 g, Rfl: 1   aspirin EC 81 MG tablet, Take 81 mg by mouth daily., Disp: , Rfl:    atorvastatin (LIPITOR) 10 MG tablet, TAKE 1 TABLET BY MOUTH EVERY DAY, Disp:  90 tablet, Rfl: 1   amLODipine (NORVASC) 5 MG tablet, Take 1 tablet (5 mg total) by mouth daily., Disp: 30 tablet, Rfl: 2   No Known Allergies Past Medical History:  Diagnosis Date   Allergy    COVID-19 05/26/2019   Mixed hyperlipidemia     Past Surgical History:  Procedure Laterality Date   HAND SURGERY Left     Social History   Socioeconomic History   Marital status: Divorced    Spouse name: Not on file   Number of children: 3   Years of education: 13   Highest education level: High school graduate  Occupational History   Not on file  Social Needs   Financial resource strain: Not on file   Food insecurity    Worry: Not on file    Inability: Not on file   Transportation needs    Medical: Not on file    Non-medical: Not on file  Tobacco Use   Smoking status: Never Smoker   Smokeless tobacco: Never Used  Substance and Sexual Activity   Alcohol use: Yes    Alcohol/week: 0.0 standard drinks    Comment: 1-2 beers on weekends   Drug use: Never   Sexual activity: Yes  Lifestyle   Physical activity    Days per week: Not on file    Minutes per session: Not on file   Stress: Not on file  Relationships   Social connections    Talks on phone: Not on file    Gets together:  Not on file    Attends religious service: Not on file    Active member of club or organization: Not on file    Attends meetings of clubs or organizations: Not on file    Relationship status: Not on file   Intimate partner violence    Fear of current or ex partner: Not on file    Emotionally abused: Not on file    Physically abused: Not on file    Forced sexual activity: Not on file  Other Topics Concern   Not on file  Social History Narrative   Not on file        Objective:    BP (!) 144/97    Pulse 89    Temp 99.2 F (37.3 C)    Wt 187 lb (84.8 kg)    SpO2 98%    BMI 29.29 kg/m   Physical Exam Vitals signs reviewed.  Constitutional:      General: He is not in  acute distress.    Appearance: Normal appearance. He is overweight. He is not ill-appearing, toxic-appearing or diaphoretic.  HENT:     Head: Normocephalic and atraumatic.  Eyes:     General: No scleral icterus.       Right eye: No discharge.        Left eye: No discharge.     Conjunctiva/sclera: Conjunctivae normal.  Neck:     Musculoskeletal: Normal range of motion.  Cardiovascular:     Rate and Rhythm: Normal rate and regular rhythm.     Heart sounds: Normal heart sounds. No murmur. No friction rub. No gallop.   Pulmonary:     Effort: Pulmonary effort is normal. No respiratory distress.     Breath sounds: Normal breath sounds. No stridor. No wheezing, rhonchi or rales.  Musculoskeletal: Normal range of motion.  Skin:    General: Skin is warm and dry.  Neurological:     Mental Status: He is alert and oriented to person, place, and time. Mental status is at baseline.  Psychiatric:        Mood and Affect: Mood normal.        Behavior: Behavior normal.        Thought Content: Thought content normal.        Judgment: Judgment normal.     Lab Results  Component Value Date   WBC 6.0 04/18/2019   HGB 15.3 04/18/2019   HCT 45.7 04/18/2019   MCV 77 (L) 04/18/2019   PLT 361 04/18/2019   Lab Results  Component Value Date   NA 139 04/18/2019   K 4.5 04/18/2019   CO2 25 04/18/2019   GLUCOSE 85 04/18/2019   BUN 15 04/18/2019   CREATININE 1.34 (H) 04/18/2019   BILITOT 0.5 04/18/2019   ALKPHOS 79 04/18/2019   AST 24 04/18/2019   ALT 25 04/18/2019   PROT 6.8 04/18/2019   ALBUMIN 4.4 04/18/2019   CALCIUM 9.8 04/18/2019   Lab Results  Component Value Date   CHOL 175 04/18/2019   Lab Results  Component Value Date   HDL 33 (L) 04/18/2019   Lab Results  Component Value Date   LDLCALC 129 (H) 04/18/2019   Lab Results  Component Value Date   TRIG 63 04/18/2019   Lab Results  Component Value Date   CHOLHDL 5.3 (H) 04/18/2019

## 2019-06-19 ENCOUNTER — Other Ambulatory Visit: Payer: Self-pay

## 2019-06-19 ENCOUNTER — Ambulatory Visit (INDEPENDENT_AMBULATORY_CARE_PROVIDER_SITE_OTHER): Payer: 59 | Admitting: Family Medicine

## 2019-06-19 ENCOUNTER — Encounter: Payer: Self-pay | Admitting: Family Medicine

## 2019-06-19 VITALS — BP 144/97 | HR 89 | Temp 99.2°F | Wt 187.0 lb

## 2019-06-19 DIAGNOSIS — I10 Essential (primary) hypertension: Secondary | ICD-10-CM | POA: Diagnosis not present

## 2019-06-19 MED ORDER — AMLODIPINE BESYLATE 5 MG PO TABS: 5.0000 mg | ORAL_TABLET | Freq: Every day | ORAL | 2 refills | Status: DC

## 2019-06-19 NOTE — Patient Instructions (Addendum)
Reschedule colonoscopy!!!   DASH Eating Plan DASH stands for "Dietary Approaches to Stop Hypertension." The DASH eating plan is a healthy eating plan that has been shown to reduce high blood pressure (hypertension). It may also reduce your risk for type 2 diabetes, heart disease, and stroke. The DASH eating plan may also help with weight loss. What are tips for following this plan?  General guidelines  Avoid eating more than 2,300 mg (milligrams) of salt (sodium) a day. If you have hypertension, you may need to reduce your sodium intake to 1,500 mg a day.  Limit alcohol intake to no more than 1 drink a day for nonpregnant women and 2 drinks a day for men. One drink equals 12 oz of beer, 5 oz of wine, or 1 oz of hard liquor.  Work with your health care provider to maintain a healthy body weight or to lose weight. Ask what an ideal weight is for you.  Get at least 30 minutes of exercise that causes your heart to beat faster (aerobic exercise) most days of the week. Activities may include walking, swimming, or biking.  Work with your health care provider or diet and nutrition specialist (dietitian) to adjust your eating plan to your individual calorie needs. Reading food labels   Check food labels for the amount of sodium per serving. Choose foods with less than 5 percent of the Daily Value of sodium. Generally, foods with less than 300 mg of sodium per serving fit into this eating plan.  To find whole grains, look for the word "whole" as the first word in the ingredient list. Shopping  Buy products labeled as "low-sodium" or "no salt added."  Buy fresh foods. Avoid canned foods and premade or frozen meals. Cooking  Avoid adding salt when cooking. Use salt-free seasonings or herbs instead of table salt or sea salt. Check with your health care provider or pharmacist before using salt substitutes.  Do not fry foods. Cook foods using healthy methods such as baking, boiling, grilling, and  broiling instead.  Cook with heart-healthy oils, such as olive, canola, soybean, or sunflower oil. Meal planning  Eat a balanced diet that includes: ? 5 or more servings of fruits and vegetables each day. At each meal, try to fill half of your plate with fruits and vegetables. ? Up to 6-8 servings of whole grains each day. ? Less than 6 oz of lean meat, poultry, or fish each day. A 3-oz serving of meat is about the same size as a deck of cards. One egg equals 1 oz. ? 2 servings of low-fat dairy each day. ? A serving of nuts, seeds, or beans 5 times each week. ? Heart-healthy fats. Healthy fats called Omega-3 fatty acids are found in foods such as flaxseeds and coldwater fish, like sardines, salmon, and mackerel.  Limit how much you eat of the following: ? Canned or prepackaged foods. ? Food that is high in trans fat, such as fried foods. ? Food that is high in saturated fat, such as fatty meat. ? Sweets, desserts, sugary drinks, and other foods with added sugar. ? Full-fat dairy products.  Do not salt foods before eating.  Try to eat at least 2 vegetarian meals each week.  Eat more home-cooked food and less restaurant, buffet, and fast food.  When eating at a restaurant, ask that your food be prepared with less salt or no salt, if possible. What foods are recommended? The items listed may not be a complete list. Talk  with your dietitian about what dietary choices are best for you. Grains Whole-grain or whole-wheat bread. Whole-grain or whole-wheat pasta. Brown rice. [place]. Bulgur. Whole-grain and low-sodium cereals. Pita bread. Low-fat, low-sodium crackers. Whole-wheat flour tortillas. Vegetables Fresh or frozen vegetables (raw, steamed, roasted, or grilled). Low-sodium or reduced-sodium tomato and vegetable juice. Low-sodium or reduced-sodium tomato sauce and tomato paste. Low-sodium or reduced-sodium canned vegetables. Fruits All fresh, dried, or frozen fruit. Canned  fruit in natural juice (without added sugar). Meat and other protein foods Skinless chicken or [place]. Ground chicken or [place]. Pork with fat trimmed off. Fish and seafood. Egg whites. Dried beans, peas, or lentils. Unsalted nuts, nut butters, and seeds. Unsalted canned beans. Lean cuts of beef with fat trimmed off. Low-sodium, lean deli meat. Dairy Low-fat (1%) or fat-free (skim) milk. Fat-free, low-fat, or reduced-fat cheeses. Nonfat, low-sodium ricotta or cottage cheese. Low-fat or nonfat yogurt. Low-fat, low-sodium cheese. Fats and oils Soft margarine without trans fats. Vegetable oil. Low-fat, reduced-fat, or light mayonnaise and salad dressings (reduced-sodium). Canola, safflower, olive, soybean, and sunflower oils. Avocado. Seasoning and other foods Herbs. Spices. Seasoning mixes without salt. Unsalted popcorn and pretzels. Fat-free sweets. What foods are not recommended? The items listed may not be a complete list. Talk with your dietitian about what dietary choices are best for you. Grains Baked goods made with fat, such as croissants, muffins, or some breads. Dry pasta or rice meal packs. Vegetables Creamed or fried vegetables. Vegetables in a cheese sauce. Regular canned vegetables (not low-sodium or reduced-sodium). Regular canned tomato sauce and paste (not low-sodium or reduced-sodium). Regular tomato and vegetable juice (not low-sodium or reduced-sodium). Angie Fava. Olives. Fruits Canned fruit in a light or heavy syrup. Fried fruit. Fruit in cream or butter sauce. Meat and other protein foods Fatty cuts of meat. Ribs. Fried meat. Berniece Salines. Sausage. Bologna and other processed lunch meats. Salami. Fatback. Hotdogs. Bratwurst. Salted nuts and seeds. Canned beans with added salt. Canned or smoked fish. Whole eggs or egg yolks. Chicken or [place] with skin. Dairy Whole or 2% milk, cream, and half-and-half. Whole or full-fat cream cheese. Whole-fat or sweetened yogurt. Full-fat cheese.  Nondairy creamers. Whipped toppings. Processed cheese and cheese spreads. Fats and oils Butter. Stick margarine. Lard. Shortening. Ghee. Bacon fat. Tropical oils, such as coconut, palm kernel, or palm oil. Seasoning and other foods Salted popcorn and pretzels. Onion salt, garlic salt, seasoned salt, table salt, and sea salt. Worcestershire sauce. Tartar sauce. Barbecue sauce. Teriyaki sauce. Soy sauce, including reduced-sodium. Steak sauce. Canned and packaged gravies. Fish sauce. Oyster sauce. Cocktail sauce. Horseradish that you find on the shelf. Ketchup. Mustard. Meat flavorings and tenderizers. Bouillon cubes. Hot sauce and Tabasco sauce. Premade or packaged marinades. Premade or packaged taco seasonings. Relishes. Regular salad dressings. Where to find more information:  National Heart, Lung, and [place]: https://wilson-eaton.com/  American Heart Association: www.heart.org Summary  The DASH eating plan is a healthy eating plan that has been shown to reduce high blood pressure (hypertension). It may also reduce your risk for type 2 diabetes, heart disease, and stroke.  With the DASH eating plan, you should limit salt (sodium) intake to 2,300 mg a day. If you have hypertension, you may need to reduce your sodium intake to 1,500 mg a day.  When on the DASH eating plan, aim to eat more fresh fruits and vegetables, whole grains, lean proteins, low-fat dairy, and heart-healthy fats.  Work with your health care provider or diet and nutrition specialist (dietitian) to adjust your  eating plan to your individual calorie needs. This information is not intended to replace advice given to you by your health care provider. Make sure you discuss any questions you have with your health care provider. Document Released: 08/06/2011 Document Revised: 07/30/2017 Document Reviewed: 08/10/2016 Elsevier Patient Education  2020 [employer].

## 2019-09-12 ENCOUNTER — Other Ambulatory Visit: Payer: Self-pay | Admitting: Family Medicine

## 2019-09-12 DIAGNOSIS — I10 Essential (primary) hypertension: Secondary | ICD-10-CM

## 2019-10-19 ENCOUNTER — Telehealth: Payer: Self-pay | Admitting: Family Medicine

## 2019-10-19 DIAGNOSIS — Z1211 Encounter for screening for malignant neoplasm of colon: Secondary | ICD-10-CM

## 2019-10-19 NOTE — Telephone Encounter (Signed)
Please call patient and encouraged him to reschedule appointment with gastroenterology for his colonoscopy. Please offer Cologuard if he does not wish to have colonoscopy at this time.

## 2019-10-19 NOTE — Telephone Encounter (Signed)
Left message to call back  

## 2019-10-27 NOTE — Telephone Encounter (Signed)
His job requires a great deal of travel.  He is willing to do a cologuard.

## 2019-10-30 NOTE — Telephone Encounter (Signed)
Cologuard ordered

## 2019-11-16 ENCOUNTER — Telehealth: Payer: Self-pay | Admitting: Family Medicine

## 2019-11-16 NOTE — Telephone Encounter (Signed)
I have received a fax from [place] and [place] regarding an abnormal chest x-ray that recommended follow-up. Please contact patient and have him schedule an appointment for this.

## 2019-11-16 NOTE — Telephone Encounter (Signed)
Left message for pt informing that we did receive the letter and that he needs to call the office back to schedule an apointment

## 2019-11-23 ENCOUNTER — Ambulatory Visit (INDEPENDENT_AMBULATORY_CARE_PROVIDER_SITE_OTHER): Payer: 59 | Admitting: Family Medicine

## 2019-11-23 ENCOUNTER — Encounter: Payer: Self-pay | Admitting: Family Medicine

## 2019-11-23 DIAGNOSIS — R9389 Abnormal findings on diagnostic imaging of other specified body structures: Secondary | ICD-10-CM | POA: Diagnosis not present

## 2019-11-23 NOTE — Progress Notes (Addendum)
Virtual Visit via Telephone Note  I connected with Leon Lopez on 11/23/19 at 1:09 PM by telephone and verified that I am speaking with the correct person using two identifiers. Leon Lopez is currently located in his car and nobody is currently with him during this visit. The provider, Gwenlyn Fudge, FNP is located in their home at time of visit.  I discussed the limitations, risks, security and privacy concerns of performing an evaluation and management service by telephone and the availability of in person appointments. I also discussed with the patient that there may be a patient responsible charge related to this service. The patient expressed understanding and agreed to proceed.  Subjective: PCP: Gwenlyn Fudge, FNP  Chief Complaint  Patient presents with  . 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 I received a fax from them that they patient had an abnormal chest x-ray that recommended follow-up. Patient reports he was told they were going to send me a copy of the x-ray.    ROS: Per HPI  Current Outpatient Medications:  .  albuterol (VENTOLIN HFA) 108 (90 Base) MCG/ACT inhaler, Inhale 2 puffs into the lungs every 6 (six) hours as needed for wheezing or shortness of breath., Disp: 18 g, Rfl: 1 .  amLODipine (NORVASC) 5 MG tablet, TAKE 1 TABLET BY MOUTH EVERY DAY, Disp: 90 tablet, Rfl: 0 .  aspirin EC 81 MG tablet, Take 81 mg by mouth daily., Disp: , Rfl:  .  atorvastatin (LIPITOR) 10 MG tablet, TAKE 1 TABLET BY MOUTH EVERY DAY, Disp: 90 tablet, Rfl: 1  No Known Allergies Past Medical History:  Diagnosis Date  . Allergy   . COVID-19 05/26/2019  . Essential hypertension   . Mixed hyperlipidemia     Observations/Objective: A&O  No respiratory distress or wheezing audible over the phone Mood, judgement, and thought processes all WNL  Assessment and Plan: 1. Abnormal chest x-ray -  Recommended patient have repeat chest x-ray today in the office but he did not want to do this as he was under the impression from [facility] Cheree Ditto that I would be receiving a copy of their x-ray. I have placed a call to Ervin Knack to discuss further and see if we can get a copy of the imaging.  - Note for future scheduling: patient will be leaving in the morning and going out of town for work. He will be in [place] for one month and then outside of [place] for the next month. His day off will be Mondays.   Addendum: I have spoken to [employer]. It has been recommended I start with a repeat chest x-ray so that if a CT scan is needed the insurance carrier will be satisfied by having the x-ray. I have called patient and left him a voicemail to please come by our office ASAP to have this completed as I know he is about to go out of town.    Follow Up Instructions:  I discussed the assessment and treatment plan with the patient. The patient was provided an opportunity to ask questions and all were answered. The patient agreed with the plan and demonstrated an understanding of the instructions.   The patient was advised to call back or seek an in-person evaluation if the symptoms worsen or if the condition fails to improve as anticipated.  The above assessment and management plan was  discussed with the patient. The patient verbalized understanding of and has agreed to the management plan. Patient is aware to call the clinic if symptoms persist or worsen. Patient is aware when to return to the clinic for a follow-up visit. Patient educated on when it is appropriate to go to the emergency department.   Time call ended: 1:19 PM  I provided 12 minutes of non-face-to-face time during this encounter.  Hendricks Limes, MSN, APRN, FNP-C [facility] 11/23/19

## 2019-11-23 NOTE — Addendum Note (Signed)
Addended by: Gwenlyn Fudge on: 11/23/2019 03:37 PM   Modules accepted: Orders

## 2019-11-24 ENCOUNTER — Other Ambulatory Visit: Payer: Self-pay

## 2019-11-24 ENCOUNTER — Other Ambulatory Visit: Payer: Self-pay | Admitting: Family Medicine

## 2019-11-24 ENCOUNTER — Other Ambulatory Visit: Payer: 59

## 2019-11-24 ENCOUNTER — Ambulatory Visit (INDEPENDENT_AMBULATORY_CARE_PROVIDER_SITE_OTHER): Payer: 59

## 2019-11-24 DIAGNOSIS — R918 Other nonspecific abnormal finding of lung field: Secondary | ICD-10-CM

## 2019-11-24 DIAGNOSIS — R9389 Abnormal findings on diagnostic imaging of other specified body structures: Secondary | ICD-10-CM

## 2019-11-24 DIAGNOSIS — R911 Solitary pulmonary nodule: Secondary | ICD-10-CM

## 2019-12-04 ENCOUNTER — Ambulatory Visit (HOSPITAL_COMMUNITY): Payer: 59

## 2019-12-05 LAB — COLOGUARD: Cologuard: NEGATIVE

## 2019-12-23 ENCOUNTER — Ambulatory Visit: Payer: 59 | Attending: Internal Medicine

## 2019-12-23 DIAGNOSIS — Z23 Encounter for immunization: Secondary | ICD-10-CM

## 2019-12-23 NOTE — Progress Notes (Signed)
   Covid-19 Vaccination Clinic  Name:  Leon Lopez    MRN: [id number] DOB: Aug 30, 1956  12/23/2019  Mr. Leon Lopez was observed post Covid-19 immunization for 15 minutes without incident. He was provided with Vaccine Information Sheet and instruction to access the V-Safe system.   Mr. Leon Lopez was instructed to call 911 with any severe reactions post vaccine: Marland Kitchen Difficulty breathing  . Swelling of face and throat  . A fast heartbeat  . A bad rash all over body  . Dizziness and weakness   Immunizations Administered    Name Date Dose VIS Date Route   Pfizer COVID-19 Vaccine 12/23/2019 10:37 AM 0.3 mL 10/25/2018 Intramuscular   Manufacturer: [employer], [employer]   Lot: [id number]   NDC: [address]-0

## 2020-01-01 ENCOUNTER — Ambulatory Visit (HOSPITAL_COMMUNITY)
Admission: RE | Admit: 2020-01-01 | Discharge: 2020-01-01 | Disposition: A | Discharge status: Home / Self Care | Payer: 59 | Source: Ambulatory Visit | Attending: Family Medicine | Admitting: Family Medicine

## 2020-01-01 ENCOUNTER — Other Ambulatory Visit: Payer: Self-pay

## 2020-01-01 DIAGNOSIS — R911 Solitary pulmonary nodule: Secondary | ICD-10-CM | POA: Diagnosis present

## 2020-01-01 DIAGNOSIS — R918 Other nonspecific abnormal finding of lung field: Secondary | ICD-10-CM | POA: Insufficient documentation

## 2020-01-15 ENCOUNTER — Ambulatory Visit: Payer: 59 | Attending: Internal Medicine

## 2020-01-15 DIAGNOSIS — Z23 Encounter for immunization: Secondary | ICD-10-CM

## 2020-01-15 NOTE — Progress Notes (Signed)
   Covid-19 Vaccination Clinic  Name:  Leon Lopez    MRN: [id number] DOB: 09/16/1955  01/15/2020  [name] was observed post Covid-19 immunization for 15 minutes without incident. He was provided with Vaccine Information Sheet and instruction to access the V-Safe system.   [name] was instructed to call 911 with any severe reactions post vaccine: Marland Kitchen Difficulty breathing  . Swelling of face and throat  . A fast heartbeat  . A bad rash all over body  . Dizziness and weakness   Immunizations Administered    Name Date Dose VIS Date Route   Pfizer COVID-19 Vaccine 01/15/2020 12:09 PM 0.3 mL 10/25/2018 Intramuscular   Manufacturer: [employer], [employer]   Lot: [id number]   NDC: [address]-7

## 2021-12-29 IMAGING — DX DG CHEST 2V
2 series · 2 of 2 positions shown · non-contrast
Comparison: June 06, 2019

CLINICAL DATA: Prior pneumonia.  History of asbestos exposure

EXAM:
CHEST - 2 VIEW

[chest pa]
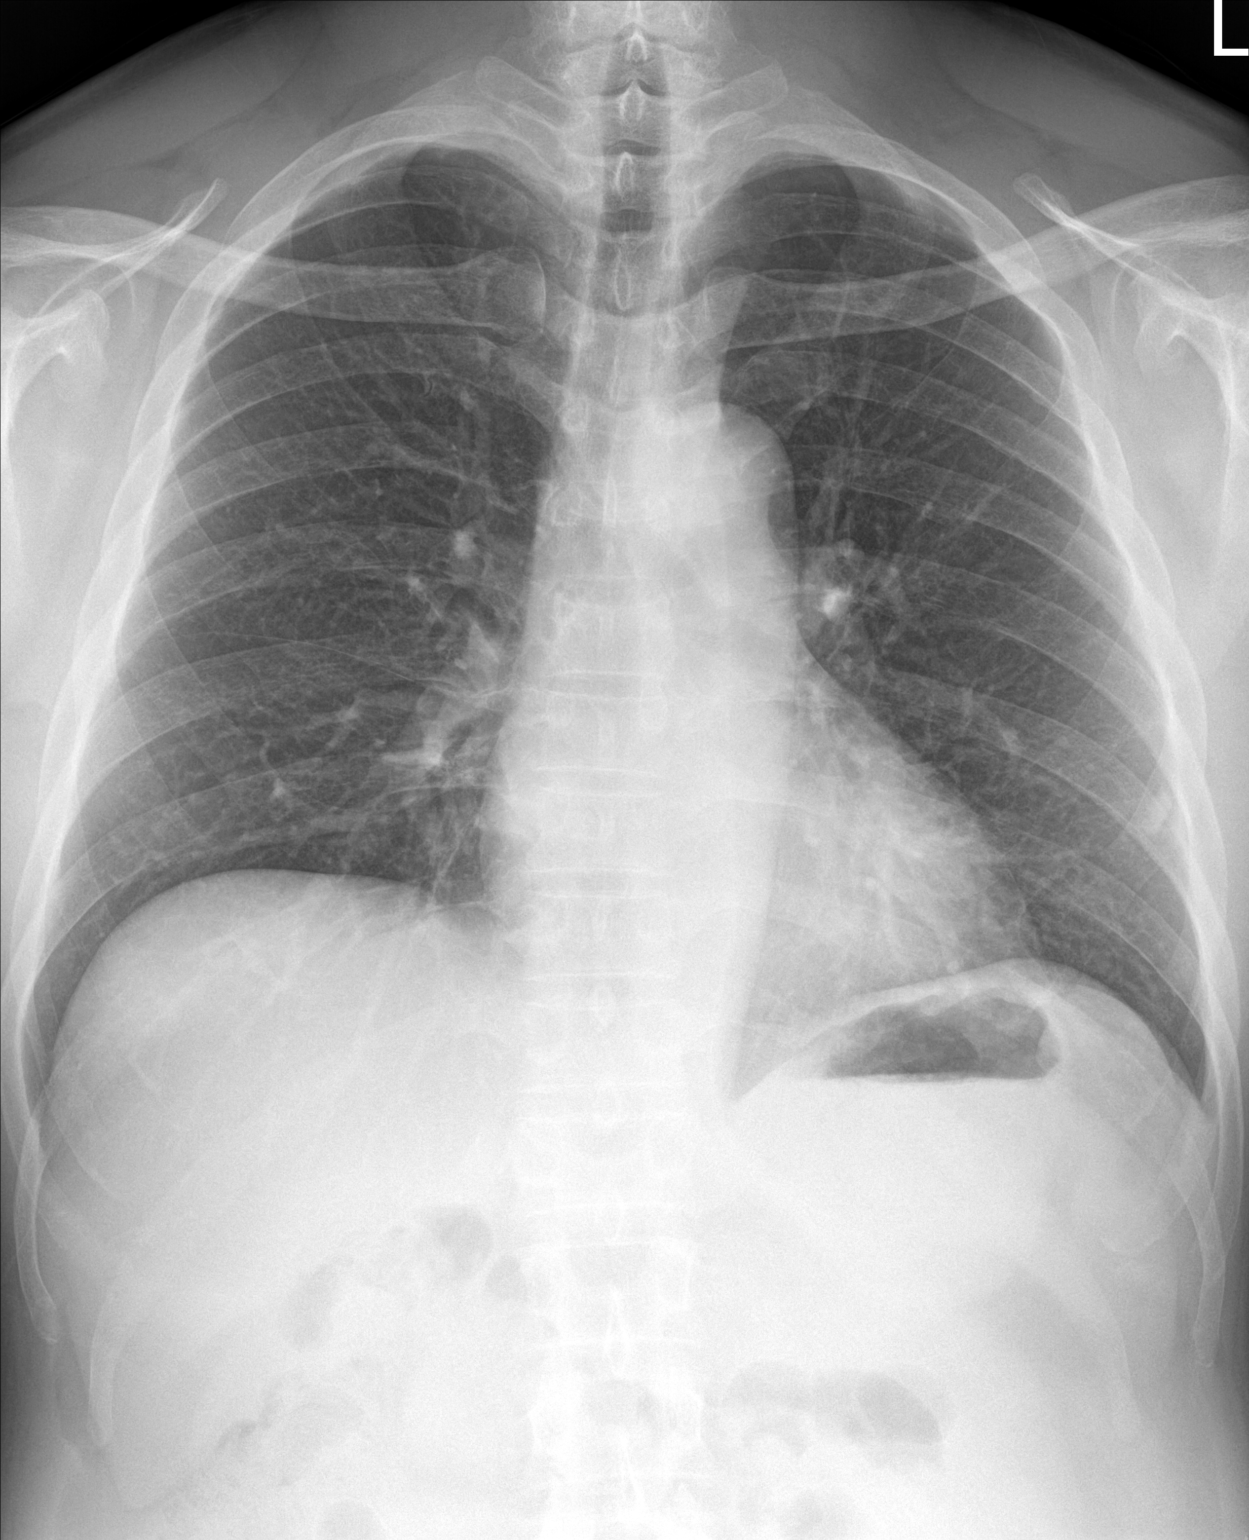

[chest lat]
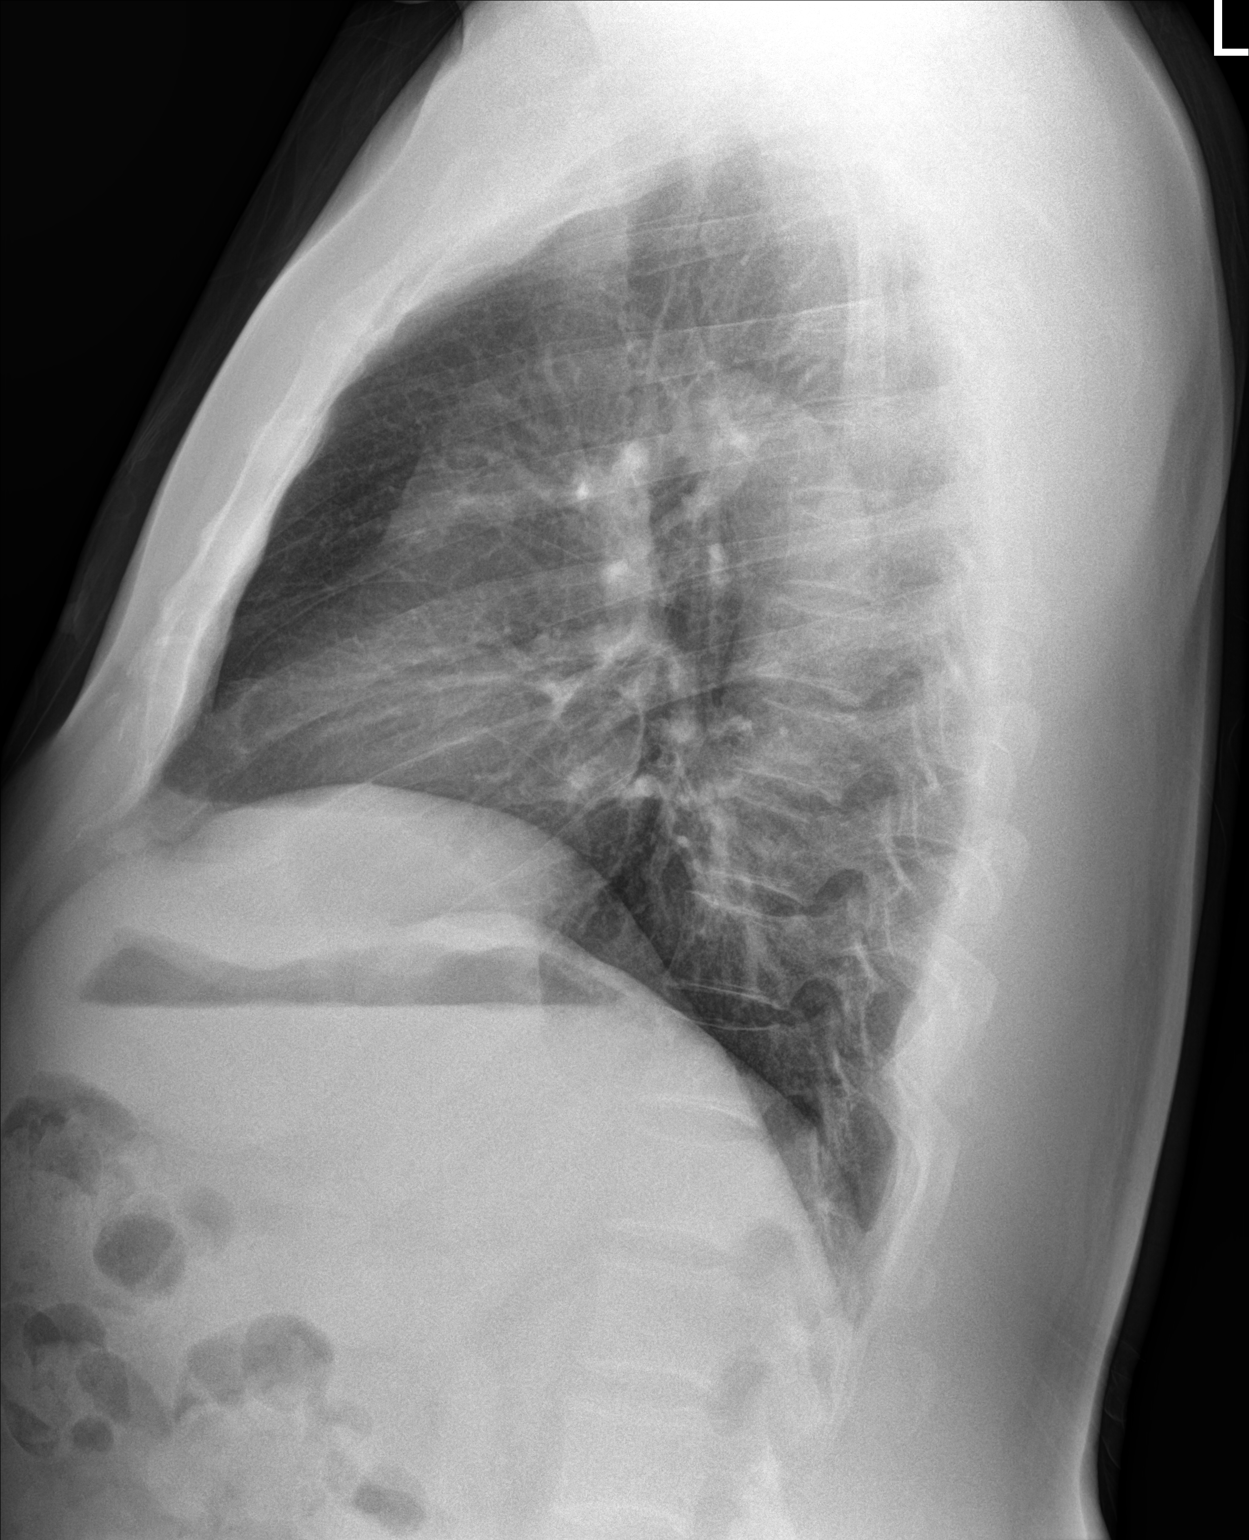

[2 of 2 positions shown; findings below may reference images not displayed]

FINDINGS: In the interval since the previous study, there is been clearing of
left lower lobe infiltrate. A small calcified pleural plaque is
noted laterally on the left inferiorly. Lungs elsewhere clear. Heart
size and pulmonary vascular normal. No adenopathy. No bone lesions.
IMPRESSION: Interval clearing of lingular region infiltrate. Small apparent
calcified pleural plaque on the left inferiorly and laterally. Lungs
elsewhere clear. Interstitium does not appear appreciably thickened
by radiography.

Heart size normal.  No adenopathy.

## 2022-02-05 IMAGING — CT CT CHEST W/O CM
2 of 4 series · 15 of 36 positions shown, 18 images · non-contrast
Comparison: Chest radiograph, 11/24/2019

CLINICAL DATA: Abnormal chest x-ray, history of asbestos exposure

EXAM:
CT CHEST WITHOUT CONTRAST
TECHNIQUE: Multidetector CT imaging of the chest was performed following the
standard protocol without IV contrast.

[Series 2: routine chest without · axial · non-contrast · 0.66mm/px · z∈[+1320,+1602]mm · 12 of 167 slices shown, 15 images]
[im 13/167  mediastinal]
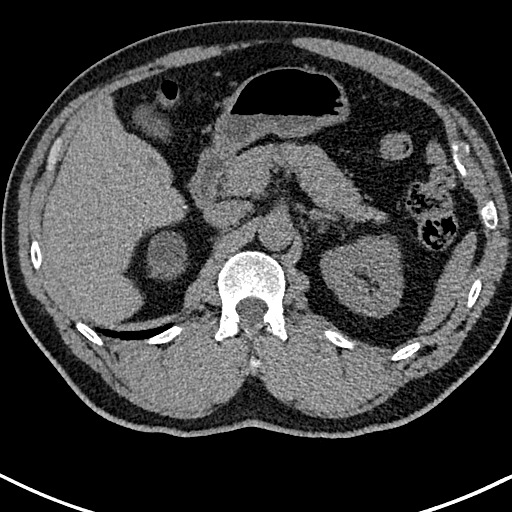
[im 13/167  lung]
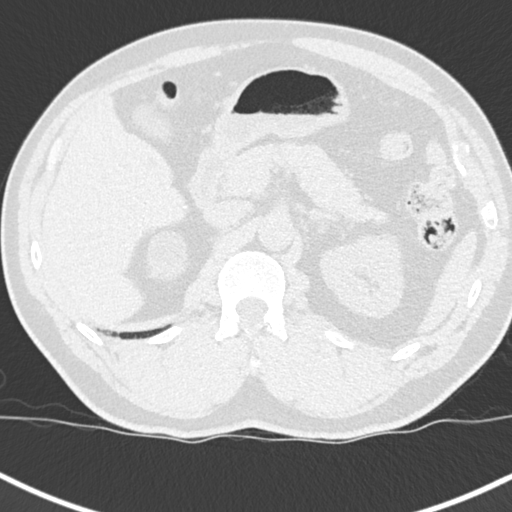
[im 26/167  lung]
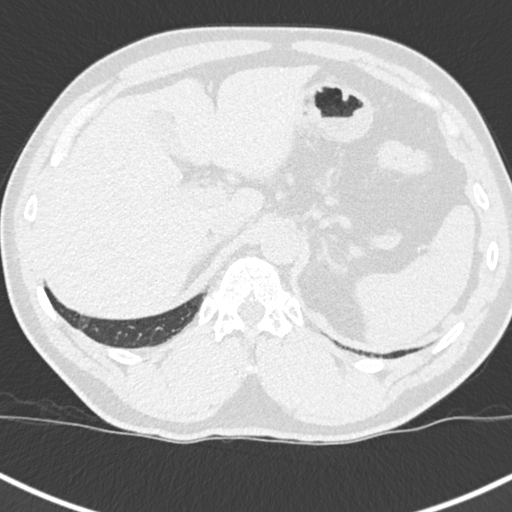
[im 39/167  lung]
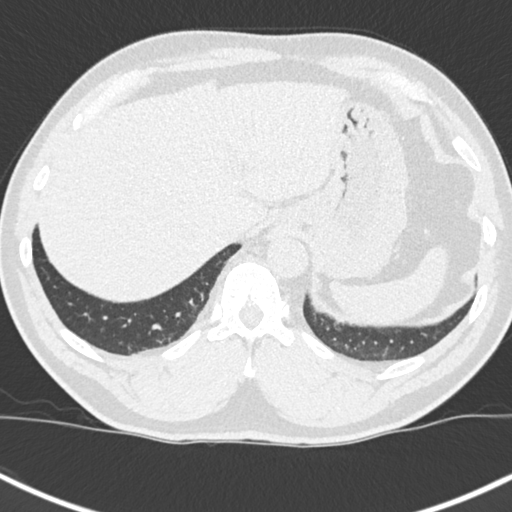
[im 52/167  lung]
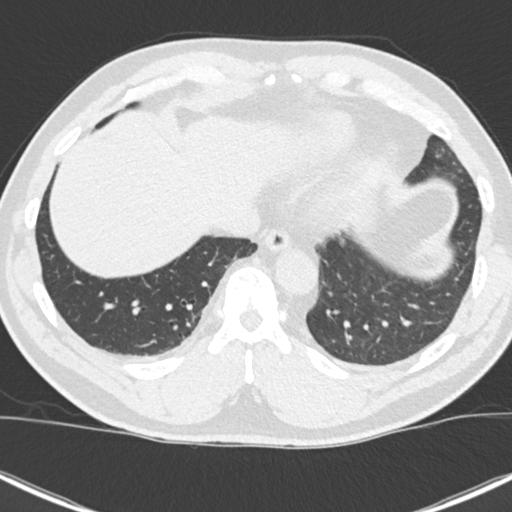
[im 64/167  mediastinal]
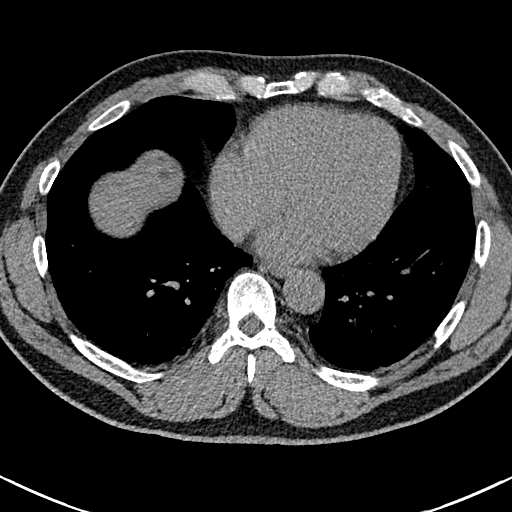
[im 64/167  lung]
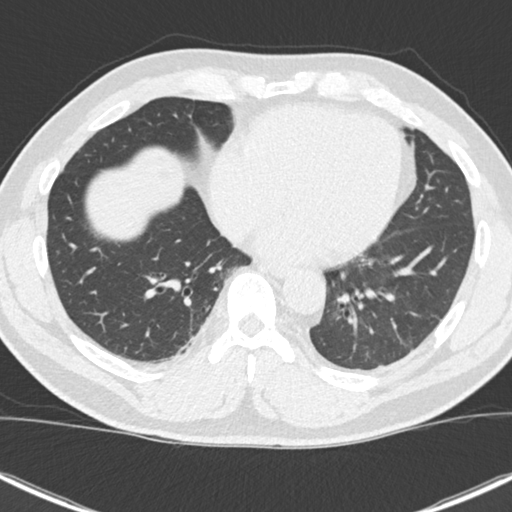
[im 77/167  lung]
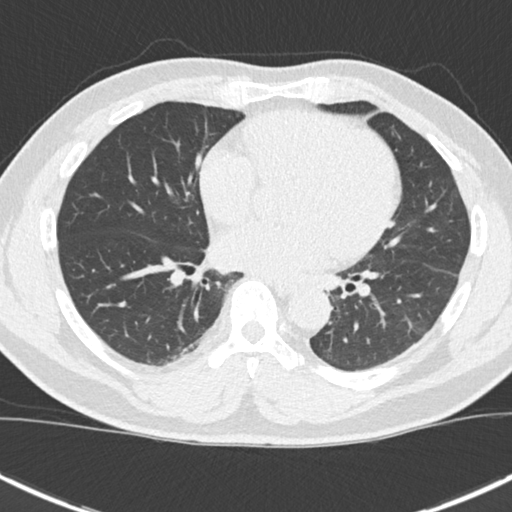
[im 90/167  lung]
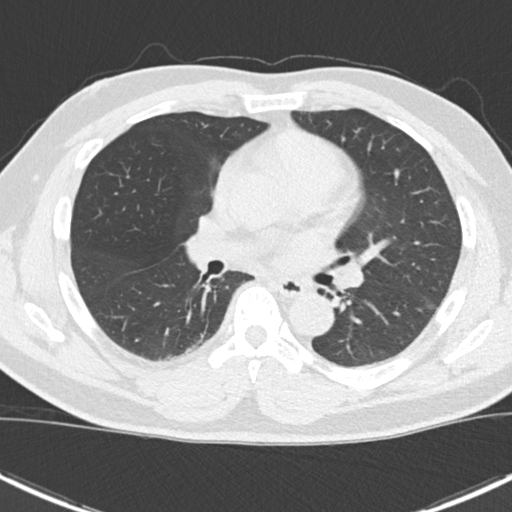
[im 103/167  lung]
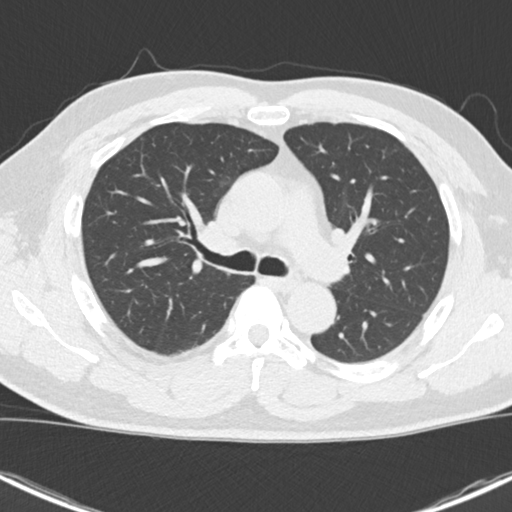
[im 115/167  mediastinal]
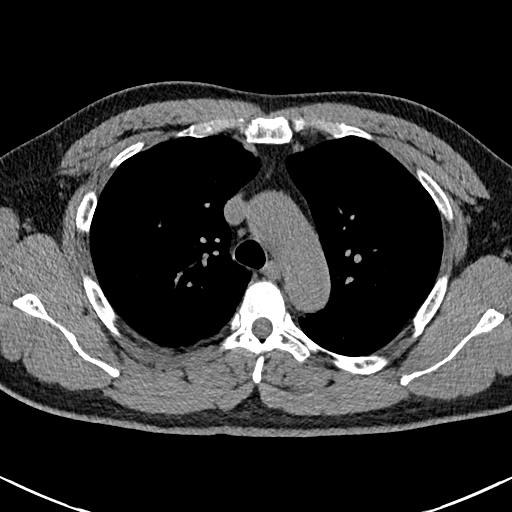
[im 115/167  lung]
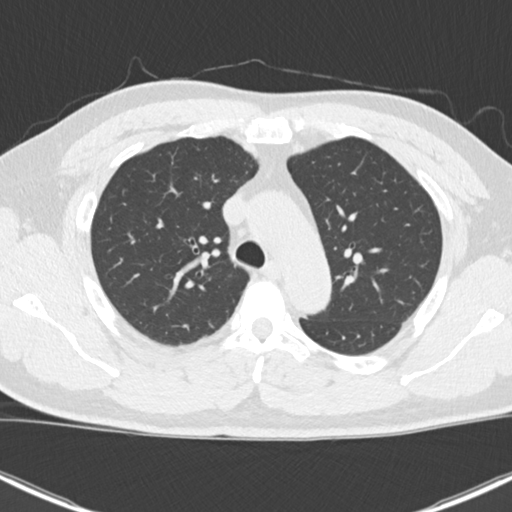
[im 128/167  lung]
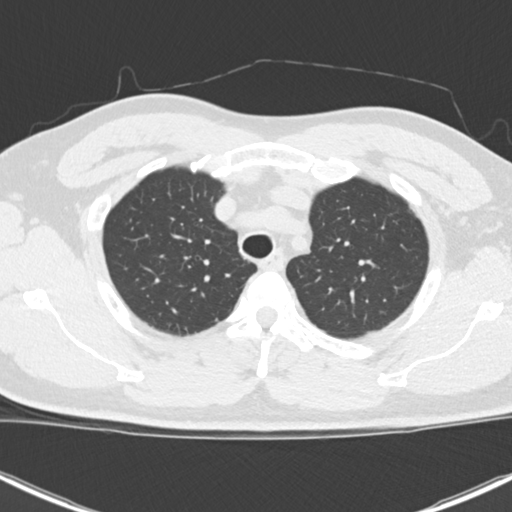
[im 141/167  lung]
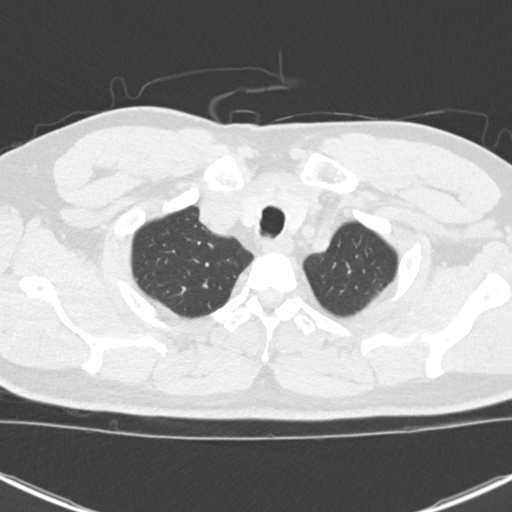
[im 154/167  lung]
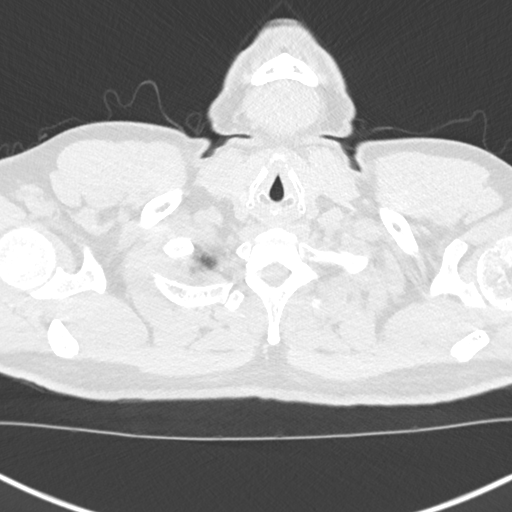

[Series 5: coronal · coronal · 0.65mm/px · 3 of 151 slices shown]
[im 31/151  lung]
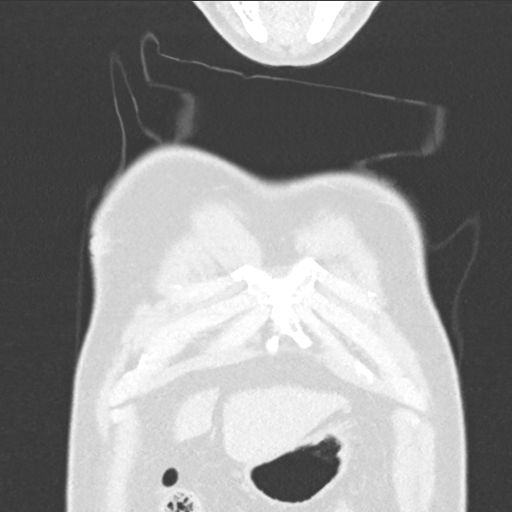
[im 61/151  lung]
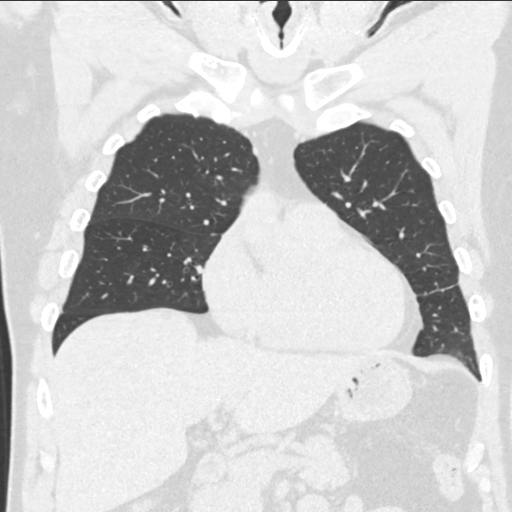
[im 91/151  lung]
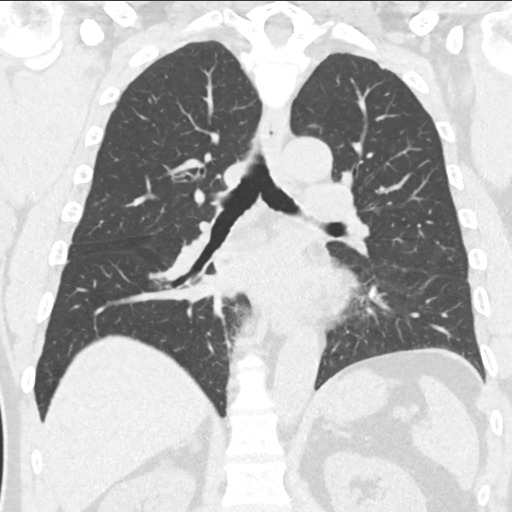

[15 of 36 positions shown; findings below may reference images not displayed]

FINDINGS: Cardiovascular: No significant vascular findings. Normal heart size.
No pericardial effusion.

Mediastinum/Nodes: No enlarged mediastinal, hilar, or axillary lymph
nodes. Thyroid gland, trachea, and esophagus demonstrate no
significant findings.

Lungs/Pleura: Minimal nonspecific bibasilar scarring. No pleural
effusion or pneumothorax.

Upper Abdomen: No acute abnormality.

Musculoskeletal: No chest wall mass or suspicious bone lesions
identified.
IMPRESSION: Minimal nonspecific bibasilar scarring. No evidence of asbestos
pleural disease or fibrotic pulmonary asbestosis.
